# Patient Record
Sex: Female | Born: 1935 | Race: White | Hispanic: No | Smoking: Never smoker
Health system: Southern US, Community
[De-identification: ages and names within clinical notes are randomized; demographics above are authoritative.]

## PROBLEM LIST (undated history)

## (undated) DIAGNOSIS — K5790 Diverticulosis of intestine, part unspecified, without perforation or abscess without bleeding: Secondary | ICD-10-CM

## (undated) DIAGNOSIS — M810 Age-related osteoporosis without current pathological fracture: Secondary | ICD-10-CM

## (undated) DIAGNOSIS — E079 Disorder of thyroid, unspecified: Secondary | ICD-10-CM

## (undated) DIAGNOSIS — R269 Unspecified abnormalities of gait and mobility: Secondary | ICD-10-CM

## (undated) DIAGNOSIS — E119 Type 2 diabetes mellitus without complications: Secondary | ICD-10-CM

## (undated) DIAGNOSIS — F0390 Unspecified dementia without behavioral disturbance: Secondary | ICD-10-CM

## (undated) DIAGNOSIS — F329 Major depressive disorder, single episode, unspecified: Secondary | ICD-10-CM

## (undated) HISTORY — DX: Type 2 diabetes mellitus without complications: E11.9

## (undated) HISTORY — PX: CATARACT EXTRACTION W/ INTRAOCULAR LENS IMPLANT: SHX1309

## (undated) HISTORY — DX: Unspecified abnormalities of gait and mobility: R26.9

## (undated) HISTORY — DX: Age-related osteoporosis without current pathological fracture: M81.0

## (undated) HISTORY — DX: Major depressive disorder, single episode, unspecified: F32.9

## (undated) HISTORY — DX: Diverticulosis of intestine, part unspecified, without perforation or abscess without bleeding: K57.90

---

## 1999-09-25 ENCOUNTER — Encounter: Admission: RE | Admit: 1999-09-25 | Discharge: 1999-09-25 | Payer: Self-pay | Admitting: *Deleted

## 1999-09-25 ENCOUNTER — Encounter: Payer: Self-pay | Admitting: *Deleted

## 2000-05-30 ENCOUNTER — Ambulatory Visit (HOSPITAL_COMMUNITY): Admission: RE | Admit: 2000-05-30 | Discharge: 2000-05-30 | Payer: Self-pay | Admitting: Gastroenterology

## 2000-09-30 ENCOUNTER — Encounter: Payer: Self-pay | Admitting: Geriatric Medicine

## 2000-09-30 ENCOUNTER — Encounter: Admission: RE | Admit: 2000-09-30 | Discharge: 2000-09-30 | Payer: Self-pay | Admitting: Geriatric Medicine

## 2001-10-02 ENCOUNTER — Encounter: Admission: RE | Admit: 2001-10-02 | Discharge: 2001-10-02 | Payer: Self-pay | Admitting: Geriatric Medicine

## 2001-10-02 ENCOUNTER — Encounter: Payer: Self-pay | Admitting: Geriatric Medicine

## 2001-12-04 ENCOUNTER — Other Ambulatory Visit: Admission: RE | Admit: 2001-12-04 | Discharge: 2001-12-04 | Payer: Self-pay | Admitting: Geriatric Medicine

## 2002-10-04 ENCOUNTER — Encounter: Admission: RE | Admit: 2002-10-04 | Discharge: 2002-10-04 | Payer: Self-pay | Admitting: Geriatric Medicine

## 2002-10-04 ENCOUNTER — Encounter: Payer: Self-pay | Admitting: Geriatric Medicine

## 2003-11-09 ENCOUNTER — Encounter: Admission: RE | Admit: 2003-11-09 | Discharge: 2003-11-09 | Payer: Self-pay | Admitting: Geriatric Medicine

## 2004-02-25 ENCOUNTER — Emergency Department (HOSPITAL_COMMUNITY): Admission: EM | Admit: 2004-02-25 | Discharge: 2004-02-25 | Payer: Self-pay | Admitting: *Deleted

## 2004-02-25 ENCOUNTER — Inpatient Hospital Stay (HOSPITAL_COMMUNITY): Admission: AD | Admit: 2004-02-25 | Discharge: 2004-03-07 | Payer: Self-pay | Admitting: Internal Medicine

## 2004-02-27 ENCOUNTER — Encounter (INDEPENDENT_AMBULATORY_CARE_PROVIDER_SITE_OTHER): Payer: Self-pay | Admitting: *Deleted

## 2004-02-29 ENCOUNTER — Encounter (INDEPENDENT_AMBULATORY_CARE_PROVIDER_SITE_OTHER): Payer: Self-pay | Admitting: *Deleted

## 2004-03-05 ENCOUNTER — Encounter (INDEPENDENT_AMBULATORY_CARE_PROVIDER_SITE_OTHER): Payer: Self-pay | Admitting: *Deleted

## 2004-03-07 ENCOUNTER — Inpatient Hospital Stay (HOSPITAL_COMMUNITY)
Admission: RE | Admit: 2004-03-07 | Discharge: 2004-03-15 | Payer: Self-pay | Admitting: Physical Medicine & Rehabilitation

## 2004-03-23 ENCOUNTER — Encounter
Admission: RE | Admit: 2004-03-23 | Discharge: 2004-06-21 | Payer: Self-pay | Admitting: Physical Medicine & Rehabilitation

## 2004-04-20 ENCOUNTER — Ambulatory Visit (HOSPITAL_COMMUNITY): Admission: RE | Admit: 2004-04-20 | Discharge: 2004-04-20 | Payer: Self-pay | Admitting: Neurology

## 2004-10-25 ENCOUNTER — Ambulatory Visit (HOSPITAL_COMMUNITY): Admission: RE | Admit: 2004-10-25 | Discharge: 2004-10-25 | Payer: Self-pay | Admitting: Neurosurgery

## 2004-11-22 ENCOUNTER — Encounter: Admission: RE | Admit: 2004-11-22 | Discharge: 2004-11-22 | Payer: Self-pay | Admitting: Geriatric Medicine

## 2005-10-30 ENCOUNTER — Ambulatory Visit (HOSPITAL_COMMUNITY): Admission: RE | Admit: 2005-10-30 | Discharge: 2005-10-30 | Payer: Self-pay | Admitting: Neurosurgery

## 2006-01-29 ENCOUNTER — Encounter: Admission: RE | Admit: 2006-01-29 | Discharge: 2006-01-29 | Payer: Self-pay | Admitting: Geriatric Medicine

## 2006-05-20 ENCOUNTER — Encounter: Admission: RE | Admit: 2006-05-20 | Discharge: 2006-05-20 | Payer: Self-pay | Admitting: Geriatric Medicine

## 2006-06-04 ENCOUNTER — Encounter (INDEPENDENT_AMBULATORY_CARE_PROVIDER_SITE_OTHER): Payer: Self-pay | Admitting: Specialist

## 2006-06-04 ENCOUNTER — Other Ambulatory Visit: Admission: RE | Admit: 2006-06-04 | Discharge: 2006-06-04 | Payer: Self-pay | Admitting: Diagnostic Radiology

## 2006-06-04 ENCOUNTER — Encounter: Admission: RE | Admit: 2006-06-04 | Discharge: 2006-06-04 | Payer: Self-pay | Admitting: Geriatric Medicine

## 2006-06-17 ENCOUNTER — Encounter: Admission: RE | Admit: 2006-06-17 | Discharge: 2006-06-17 | Payer: Self-pay | Admitting: Geriatric Medicine

## 2006-08-26 ENCOUNTER — Encounter (INDEPENDENT_AMBULATORY_CARE_PROVIDER_SITE_OTHER): Payer: Self-pay | Admitting: *Deleted

## 2006-08-27 ENCOUNTER — Inpatient Hospital Stay (HOSPITAL_COMMUNITY): Admission: RE | Admit: 2006-08-27 | Discharge: 2006-08-28 | Payer: Self-pay | Admitting: General Surgery

## 2007-02-02 ENCOUNTER — Encounter: Admission: RE | Admit: 2007-02-02 | Discharge: 2007-02-02 | Payer: Self-pay | Admitting: Geriatric Medicine

## 2008-06-16 ENCOUNTER — Encounter: Admission: RE | Admit: 2008-06-16 | Discharge: 2008-06-16 | Payer: Self-pay | Admitting: Internal Medicine

## 2008-08-11 ENCOUNTER — Encounter: Admission: RE | Admit: 2008-08-11 | Discharge: 2008-08-11 | Payer: Self-pay | Admitting: Geriatric Medicine

## 2009-08-15 ENCOUNTER — Encounter: Admission: RE | Admit: 2009-08-15 | Discharge: 2009-08-15 | Payer: Self-pay | Admitting: Geriatric Medicine

## 2010-09-04 ENCOUNTER — Encounter: Admission: RE | Admit: 2010-09-04 | Discharge: 2010-09-04 | Payer: Self-pay | Admitting: Geriatric Medicine

## 2010-11-11 ENCOUNTER — Encounter: Payer: Self-pay | Admitting: Family Medicine

## 2011-03-08 NOTE — Discharge Summary (Signed)
NAMERAGHAD, Theresa Robinson            ACCOUNT NO.:  [id number]   MEDICAL RECORD NO.:  [id number]          PATIENT TYPE:  INP   LOCATION:  1430                         FACILITY:  [facility]   PHYSICIAN:  Adolph Pollack, M.D.DATE OF BIRTH:  07/05/1931   DATE OF ADMISSION:  08/26/2006  DATE OF DISCHARGE:  08/28/2006                               DISCHARGE SUMMARY   PRINCIPAL DISCHARGE DIAGNOSIS:  Adenomatous nodules of the thyroid  gland.   SECONDARY DIAGNOSIS:  Postoperative hypocalcemia.   PROCEDURE:  Near total thyroidectomy and excision of central neck mass.   REASON FOR ADMISSION:  Ms. Theresa Robinson is a 77 year old female who has  bilateral thyroid lesions.  They have follicular cells in them by needle  biopsy and thus a malignant process cannot be ruled out and she was  admitted for total thyroidectomy.   HOSPITAL COURSE:  She was admitted and underwent a near total  thyroidectomy and excision of a central neck mass which happened to be a  benign lymph node.  Her thyroid pathology fortunately was benign.  Postoperatively she had a little bit of hypocalcemia which was  transient.  I started her on calcium replacement and also on Rocaltrol  and she responded nicely and was able to be discharged on her second  postoperative day.   DISPOSITION:  Discharged to home on postop day #2.  She is to take  Synthroid as thyroid replacement as well as calcium and Rocaltrol.  I  also directed her to take two Tums tablets at bedtime and to continue  her home medications.  I plan to check her calcium in approximately 4  days and then her see back in the office in about 2 weeks.      Adolph Pollack, M.D.  Electronically Signed     TJR/MEDQ  D:  09/19/2006  T:  09/19/2006  Job:  [id number]   cc:   Hal T. Stoneking, M.D.  Fax: [phone]

## 2011-03-08 NOTE — Consult Note (Signed)
NAME:  Theresa Robinson, Theresa Robinson                         ACCOUNT NO.:  [id number]   MEDICAL RECORD NO.:  [id number]                   PATIENT TYPE:  INP   LOCATION:  3023                                 FACILITY:  [facility]   PHYSICIAN:  Hewitt Shorts, M.D.            DATE OF BIRTH:  1931/02/07   DATE OF CONSULTATION:  03/01/2004  DATE OF DISCHARGE:                                   CONSULTATION   HISTORY OF PRESENT ILLNESS:  The patient is a 77 year old white female who  has an approximately three week history of double vision and gait  difficulties. She finds that she tends to walk to the left, and even when  sitting she tends to lean to the left. She finds that the double vision is  least when looking to the left, more so when looking centrally and worse  when looking to the right.   The patient initially underwent evaluation with her primary physician, Dr.  Pete Glatter. Subsequently she was admitted five days ago. It is suspected that  she may have suffered a stroke, and stroke neurology consultation from Dr.  Delia Heady was obtained.   MRI of the brain without and gadolinium was obtained and shows multiple  enhancing mass lesions in the left brachium pontis. The right occipital lobe  and numerous small lesions sprinkled through the cerebral hemispheres  bilaterally.   Extensive workup has been pursued and has been unrevealing. This included  unremarkable CT scans of the chest, abdomen, and pelvis; a normal  echocardiogram and transesophageal echocardiogram; nonreactive HIV test;  normal thyroid function tests; and a spinal tap which showed a lymphocytosis  but which was otherwise unremarkable. Spinal tap showed 4 red blood cells,  18 white blood cells, 95% lymphocytes with a normal glucose of 60, and a  mildly elevated protein of 58. ____________ bands are pending, but fungal  smear, gram stain, and AFB smear were negative. Cytology in the  cerebrospinal fluid showed lymphocytosis but  was otherwise unremarkable.   Neurosurgical consultation was requested by Dr. Pearlean Brownie for brain biopsy with  stereotaxis.   The patient denies headache, speech or language difficulties, swallowing  difficulties, seizures, or weakness.   PAST MEDICAL HISTORY:  Notable for history of depression which is treated  with Paxil. She denies any history of hypertension, myocardial infarction,  cancer, stroke, diabetes, peptic ulcer disease, or lung disease.   PAST SURGICAL HISTORY:  Previous surgical history of hysterectomy and  Cesarean section.   ALLERGIES:  She denies allergies to medications.   MEDICATIONS:  Paxil 10 mg q.a.m. and 20 mg q.p.m.   FAMILY HISTORY:  Mother had diabetes and died of bladder cancer. Father died  of complications of alcoholism. Sister had a benign brain tumor which was  resected but is disabled. Another sister had breast cancer and is alive and  well. Another sister has rheumatoid arthritis. Two sisters are alive and  well. One brother  has gout and heart disease. Two brothers are alive and  well.   SOCIAL HISTORY:  The patient is separated for the past 10 years. She has a  retarded son who lives at [place]. She does not smoke. She drank alcoholic  beverages rarely. She worked doing Warehouse manager work. She was raised in [place]  but lives in [place].   REVIEW OF SYSTEMS:  Notable for systems described in history of present  illness and past medical history but otherwise unremarkable.   PHYSICAL EXAMINATION:  GENERAL:  The patient is a well-developed, well-  nourished, white female in no acute distress.  VITAL SIGNS:  Her temperature is 98.2, pulse 62, blood pressure 111/68,  respiratory rate 18.  LUNGS:  Clear to auscultation. She has symmetric respiratory excursion.  HEART:  Regular rate and rhythm. Normal S1 and S2. There is no murmur.  ABDOMEN:  Soft, nondistended, bowel sounds are present.  EXTREMITIES:  Shows no clubbing, cyanosis, or edema.   NEUROLOGICAL:  Mental status:  The patient is awake, alert. She is oriented  to her name, [facility], [place], and May 2005. Speech is  fluent. She has good comprehension. Cranial nerves show pupils are equal,  round, and reactive to light. Extraocular movements show left esotropia and  a right eye lateral gaze paresis consistent with a right sixth nerve  paresis. Facial sensation is intact to facial movement, is symmetrical.  Hearing is intact. Palate movement is symmetrical. Shoulder shrug is  symmetrical. Tongue is midline. Motor examination shows 5/5 strength in the  upper and lower extremities. She has no drift of the upper extremities.  Sensation is intact to pinprick. Reflexes are symmetrical. Toes are  downgoing. Gait and stance were not tested.   IMPRESSION:  The patient with diplopia and gait difficulties with multiple  brain lesions, the age of which is uncertain. Extensive workup has been  unrevealing.   RECOMMENDATIONS:  I agree with Dr. Pearlean Brownie and the [facility] hospitalist that  brain biopsy is appropriate. I favor approaching the right occipital lesion  which is mostly superficial and largest via right occipital stereotactic  craniotomy with Stealth stereotaxis. I have explained the nature of the  procedure with the patient, discussed specifically the surgery and  postoperative course. We discussed risks of surgery including risks of  infection, bleeding, possible need for transfusion, risk of neurologic  dysfunction including loss of lesion in the left visual field, paralysis,  coma, and death. We discussed anesthetic risk from myocardial infarction,  stroke, pneumonia, and death. She does understand that left untreated most  likely she will eventually loose vision in the left visual field.   After discussing all of this, she does wish to proceed with surgery. We will  plan on scheduling that for Monday, May 16. We will planning on obtaining a CT scan with Stealth  protocol.                                               Hewitt Shorts, M.D.    RWN/MEDQ  D:  03/01/2004  T:  03/02/2004  Job:  [id number]   cc:   Hal T. Stoneking, M.D.  [address] [place], [place] [address]  Fax: [phone]   Pramod P. Pearlean Brownie, MD  Fax: [phone]

## 2011-03-08 NOTE — Consult Note (Signed)
NAME:  Theresa Robinson, Theresa Robinson                         ACCOUNT NO.:  [id number]   MEDICAL RECORD NO.:  [id number]                   PATIENT TYPE:  INP   LOCATION:  3023                                 FACILITY:  [facility]   PHYSICIAN:  Meade Maw, M.D.                 DATE OF BIRTH:  1931-03-01   DATE OF CONSULTATION:  02/28/2004  DATE OF DISCHARGE:                                   CONSULTATION   REQUESTING PHYSICIAN:  Pramod P. Pearlean Brownie, M.D.   INDICATIONS FOR CONSULTATION:  Evaluation for TEE.   HISTORY:  Theresa Robinson is a very pleasant 77 year old female who has had  difficulty with gait disturbance for more than two weeks. She has also  noticed double vision. This was associated with dizziness. There has been no  nausea, vomiting, headaches, or fever. She has recently underwent a  transthoracic echocardiogram which was suboptimal. Overall, it was felt that  her ejection fraction was 55 to 65%. There was mild aortic valve thickness  and mild to moderate aortic insufficiency noted.   PAST MEDICAL HISTORY:  Significant for depression.   PAST SURGICAL HISTORY:  Significant for hysterectomy and Cesarean section.   ALLERGIES:  None.   HOME MEDICATIONS:  1. Paxil 10 mg in the morning and 20 mg at night.  2. Vitamin D and calcium.   SOCIAL HISTORY:  The patient has been separated from her husband for 10  years. She lives alone in [place]. She has one child who is mentally  retarded. She has several brothers and sisters who live in [place].   REVIEW OF SYSTEMS:  She has had no fevers, no weight loss, no cough, no  chest pain, no difficulty with swallowing.   PHYSICAL EXAMINATION:  GENERAL:  Reveals an elderly female in no acute  distress. Orlene Erm is appropriate. She is afebrile. Blood pressure is 90  to 104 systolically with diastolic of 50 to 60. O2 saturations have been 96  to 97% on room air. Telemetry revealed sinus rhythm with rate of 60s to 80s  and no ectopy noted.  HEENT:  Unremarkable.  PULMONARY:  Reveals breath sounds which are equal and clear to auscultation.  CARDIOVASCULAR:  Reveals a regular rate and rhythm. There is a quite  diastolic murmur noted in the upright position. PMI is not displaced.  EXTREMITIES:  Revealed no peripheral edema.   STUDIES:  The patient has underwent a MRA of her neck and head. She had a  negative intracranial MR and normal angiography of the neck vessels. CT of  the head revealed an acute right occipital lobe infarct. Laboratory data  reveals a white count of 5.2, hemoglobin of 13.9, platelet count of 172. She  has normal electrolytes. Creatinine is 1.0. ECG reveals a sinus rhythm.  Normal R wave progression, normal ECG.   IMPRESSION:  A 77 year old female with new right occipital infarct. Workup  thus far has been  negative. She has a suboptimal transthoracic  echocardiogram. We will proceed with transesophageal echocardiogram for  further evaluation. The patient has no contraindication to the above  procedure.                                               Meade Maw, M.D.    HP/MEDQ  D:  02/28/2004  T:  02/28/2004  Job:  [id number]   cc:   Pramod P. Pearlean Brownie, MD  Fax: [phone]   Hal T. Stoneking, M.D.  [address] [place], [place] [address]  Fax: [phone]

## 2011-03-08 NOTE — Discharge Summary (Signed)
NAME:  Theresa, Robinson                         ACCOUNT NO.:  [id number]   MEDICAL RECORD NO.:  [id number]                   PATIENT TYPE:  IPS   LOCATION:  4027                                 FACILITY:  [facility]   PHYSICIAN:  Ranelle Oyster, M.D.             DATE OF BIRTH:  December 22, 1930   DATE OF ADMISSION:  03/07/2004  DATE OF DISCHARGE:  03/15/2004                                 DISCHARGE SUMMARY   DISCHARGE DIAGNOSIS:  1. Multiple brain lesions status post craniotomy, etiology unknown, status     post craniotomy/resection, etiology unknown.  2. History of depression.   HISTORY OF PRESENT ILLNESS:  The patient is a 77 year old white female with  past history of depression admitted on Feb 25, 2004 for evaluation of double  vision x2 weeks, imbalance, and dizziness. MR revealed scattered lesion  within the brain stem, cerebellum, and cerebral hemispheres. She also has  normal CSF, spinal fluid showed high albumin but no oligoclonal bands.  Consulted by neurology who thought the patient had MS versus metastatic  disease. The patient underwent a right occipital craniotomy with  intraoperative stealth stereotactic/microdissection and resection of  occipital lesion by Dr. Wyn Quaker on Mar 05, 2004. Brain biopsy is  pending at this time. The patient had also had a TEE performed and all  results have been normal.   PT is reporting at this time that the patient is ambulating 180 feet with  rolling walker, minimal assist, transfer, sit to stand with minimal assist,  bed mobility modified independent.  Placed on Decadron. The patient also  found to have thyroid nodules. Thyroid scan ordered on Mar 07, 2004.   Admitted for fever, constipation, hypoglycemia secondary to steroids. The  patient is presently on a Decadron taper. The patient was transferred to  [facility] Department on Mar 07, 2004.   PAST MEDICAL HISTORY:  As above.   PAST SURGICAL HISTORY:  Hysterectomy.   PRIMARY CARE Ilma Achee:  Dr. Marlana Latus.   ALLERGIES:  No known drug allergies.   SOCIAL HISTORY:  The patient lives in a one level home in [place], [place]. Independent prior to admission, five step entry. Denies any  tobacco or alcohol use. Has one son who has MR. Separated and retired.   ADMISSION MEDICATIONS:  1. Paxil 10 mg in the day and 20 mg a night.  2. Aspirin 81 mg once daily.  3. Calcium daily.   HOSPITAL COURSE:  Theresa Robinson was admitted to [facility]  Department on Mar 07, 2004 for comprehensive patient rehabilitation to  receive more than 3 hours of therapy daily. From a rehab standpoint, the  patient progressed very well with physical therapy and occupational therapy.  She was discharged at a modified independent level. The patient stayed in  the ADL apartment prior to discharge without any significant problems at  all. The patient remained on Decadron throughout most  of her day in rehab  and was discharged on Decadron 2 mg daily for 5 days. The patient did  experience a change in vision. She still continued to have double vision and  visual fields seemed to be shortened. Due to new symptom of shortening  vision, we performed a repeat head CT on Mar 08, 2004. It revealed multiple  enhancing lesions again noted in prior CTs and MRAs, and a small right  occipital region with an increase in edema. Dr. Ezzard Standing was aware of this  visual change and did not think it was very significant. Due to multiple  lesions, he suspected the patient to have different problems with vision. At  this time, brain biopsy is still pending at this time. It will probably be 2  weeks before results will actually return. The patient had no DVT  prophylaxis as she was ambulating. Besides occasional constipation, she had  no other really significant medical issues while in rehab. CBGs were taken  periodically while she was on Decadron and this discontinued because CBGs   were within functional limits. The patient also remained on Paxil 20 mg  q.h.s. __________ and 10 mg at night. The surgical incision on her head  healed very well. Staples were discontinued on scalp on Mar 12, 2004 and  Steri-Strips were applied. There were no other major issues that occurred  while patient was in rehab.   LABORATORY DATA:  She had a urine culture performed on Mar 07, 2004 with  3000 colonies. She had a hemoglobin of 11.3, hematocrit 32.8, white blood  cells 8.9, platelet count 209. Sodium 140, potassium 3.8, chloride 110, CO2  of 27, glucose 120, BUN 15, creatinine 0.7. AST 52, ALT 42, alkaline  phosphatase 88.   At the time of discharge, PT report indicates that the patient is able to  ambulate approximately 220 feet modified independently with no significant  device, transfer modified independently, bed mobility modified independent,  performance of ADLs modified independent. The patient's cognition with  memory within functional limits. The patient, from a PT standpoint, has made  good progress with mobility. The patient still with visual deficits and high  level balance deficits but self-corrects loss of balance and reached  modified independently level. To be discharged with modified independently  level due to vision deficits. From a therapeutic recreational level, the  patient has made great progress toward goals.  Visual deficits diminishes  further independence with TR tasks.   The patient is ready for discharge home with intermittent supervision. The  patient will continue to have rotating visual field loss with eye glass  blocked with tape. . At time of discharge, all vitals stable. The patient is  transferred home with her family. Surgical incision shows no signs of  infection.   DISCHARGE MEDICATIONS:  1. Paxil 20 mg at night and 10 mg at night.  2. Decadron 2 mg x5 days then to be discontinued. 3. Multivitamin daily.  4. Vicodin one to two tablets as  needed for any pain or headache she has.   DISCHARGE INSTRUCTIONS:  Pain is managed with Vicodin or Tylenol. No  driving, no smoking, no alcohol. She will have outpatient PT and OT at [facility], first appointment on June 3 at 11 a.m., the next appointment on June 6  at 3:30.   FOLLOW UP:  Dr. Wyn Quaker in 2 weeks, call for an appointment. Follow-up  with Dr. Pete Glatter, primary care Kyrielle Urbanski as needed, and Dr. Pearlean Brownie within  6  weeks.      Drucilla Schmidt, P.A.                         Ranelle Oyster, M.D.    LB/MEDQ  D:  03/15/2004  T:  03/17/2004  Job:  [id number]   cc:   Hewitt Shorts, M.D.  [address]  [place]  [place] [address]  Fax: [phone]   Hal T. Stoneking, M.D.  [address] [place], [place] [address]  Fax: [phone]

## 2011-03-08 NOTE — Op Note (Signed)
NAME:  Theresa Robinson, Theresa Robinson                         ACCOUNT NO.:  [id number]   MEDICAL RECORD NO.:  [id number]                   PATIENT TYPE:  INP   LOCATION:  3111                                 FACILITY:  [facility]   PHYSICIAN:  Hewitt Shorts, M.D.            DATE OF BIRTH:  December 07, 1930   DATE OF PROCEDURE:  03/05/2004  DATE OF DISCHARGE:                                 OPERATIVE REPORT   PREOPERATIVE DIAGNOSIS:  Multiple enhancing brain lesions with associated  neurologic deficits, including diplopia and imbalance.   POSTOPERATIVE DIAGNOSIS:  Multiple enhancing brain lesions with associated  neurologic deficits, including diplopia and imbalance.   PROCEDURE:  Right occipital craniotomy with intraoperative Stealth  stereotaxis and microdissection and resection of right occipital lesion.   SURGEON:  Hewitt Shorts, M.D.   ASSISTANT:  Hilda Lias, M.D.   ANESTHESIA:  General endotracheal.   INDICATIONS:  The patient is a 77 year old woman who presented with diplopia  and imbalance, who was found on CT and MRI to have multiple enhancing brain  lesions, the largest of which was in the right occipital region.  After an  extensive workup by the medicine and neurology services a diagnosis could  not be established, and therefore a brain biopsy was requested.  A decision  was made to proceed with open craniotomy and intraoperative Stealth  stereotaxis to localize the lesion for accurate biopsy.   PROCEDURE:  Prior to surgery the patient underwent a CT scan of the brain  with contrast with Stealth protocol.  The data was loaded into the Stealth  unit and preoperative planning was performed.   On the day of surgery the patient was brought to the operating room and  placed under general endotracheal anesthesia.  The patient was placed in a  three-pin Mayfield head holder, was brought up to a left-side down lateral  decubitus position, and then was gently rolled a bit further so as  to expose  the right occipital region.  All bony prominences were padded as needed, and  the patient was secured to the operating room table.  The scalp was shaved.  The presurgical registration with the Stealth unit was performed.  The right  occipital scalp was then prepped with Betadine soap and solution and draped  in a sterile fashion.  Using the intraoperative Stealth, we were able to  localize the scalp region overlying the tumor, which was infiltrated with  local anesthetic with epinephrine, and then a vertical right occipital  incision was made.  Rainey clips were applied to the scalp edges to maintain  hemostasis.  Dissection was taken down to the galea and periosteum, which  was split and divided, and the scalp retracted to either side.  We again  localized the lesion relative to the skull surface and the craniotomy bone  flap was planned.  Two bur holes were made using the [facility] Max drill.  Edges  of  the bone were waxed as necessary.  The dura was dissected from the  overlying skull, and then the craniotome attachment was used to turn the  bone flap, which was elevated and set aside to later be returned.  Nurolon 4-  0 suture was used to place multiple tack-up sutures and then the dura was  opened in an inverted U-shaped fashion, hinged inferiorly.  The brain  appeared mildly swollen.  Again using the Stealth we were able to localize  the tumor relative to our dural exposure.  The pial surface was coagulated  and divided, and then we were able to dissect down to the lesion.  The  microscope was draped and brought into the field to provide additional  magnification, illumination, and visualization, and the remainder of the  resection of the lesion was performed using microdissection and  microsurgical technique.  Further cortical vessels and pial vessels were  coagulated and divided as necessary.  We were able to dissect down to tissue  that clearly appeared abnormal.  Specimens  were obtained and sent to  pathology for both frozen and permanent section.  The case was discussed  thoroughly with Dr. Kieth Brightly, the pathologist, who reported that the  frozen section appeared to show a hypercellular tissue.  It was difficult  for her to characterize it beyond that.  Additional specimens were obtained  for further permanent section.  The resection site was irrigated, checked  for hemostasis, which was established with the use of bipolar cautery, and  then the resection site was lined with Surgicel.  The dura was approximated  with interrupted 4-0 Nurolon sutures.  The bone flap was secured with  RapidFire plates using one square and two medium straight plates.  The scalp  flap was closed in multiple layers, the galea closed with interrupted,  inverted 2-0 undyed Vicryl sutures, the skin edges closed with surgical  staples.  The wound was dressed with Adaptic and sterile gauze and dressed  with Hypafix.  The patient was then taken out of the three-pin Mayfield head  holder, brought back to a supine position, to be subsequently reversed from  the anesthetic, extubated, and transferred to the recovery room for further  care.  The estimated blood loss was 175 mL.  Sponge and needle count were  correct.                                               Hewitt Shorts, M.D.    RWN/MEDQ  D:  03/05/2004  T:  03/06/2004  Job:  [id number]

## 2011-03-08 NOTE — Op Note (Signed)
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   POSTOPERATIVE DIAGNOSES:  1. 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  2. Central neck mass.   PROCEDURES:  1. Near-total thyroidectomy.  2. Excision of central neck mass.   SURGEON:  Adolph Pollack, M.D.   ASSISTANT:  Velora Heckler, MD   ANESTHESIA:  General.   INDICATION:  Theresa Robinson was formerly Ms. Theresa Robinson when I saw her in the  office, and she is recently married.  She is 72, noted to have a right  thyroid neck mass.  She had an ultrasound demonstrating bilateral masses and  needle biopsies were performed, demonstrating follicular lesions on both  sides.  The right nodule was also cold.  She now presents for the above  procedure.  Specifically, we talked about a near-total to total  thyroidectomy.   TECHNIQUE:  She was seen in the holding area, then brought to the operating  room, placed supine on the operating table, and a general anesthetic was  administered.  The neck was placed in slight extension.  The neck and upper  chest were sterilely prepped and draped.  A curvilinear incision was made  one fingerbreadth above the clavicles in the neck bilaterally.  The  subcutaneous tissue was divided and platysma divided.  Subplatysmal flaps  were then raised to the thyroid cartilage superiorly and the substernal edge  inferiorly.  The strap muscles was divided.  I identified a central neck  mass and also the right lobe of the thyroid gland and dissected the strap  muscles free from the right lobe of the thyroid gland.  I then  medialized  the right lobe of the thyroid gland.  I divided the superior branches  between ligatures and clips and mobilized the superior lobe of the right  thyroid gland.  I then identified an inferior parathyroid gland, stripped it  free from the thyroid gland, keeping its lateral blood supply intact.  I  then divided the inferior vessels between clips.  I stayed on the capsule of  the thyroid gland and identified what I felt was a recurrent laryngeal nerve  and stayed above this and divided small medial vessels between clips.  I  then mobilized the thyroid gland off the trachea.  I did not see a definite  superior parathyroid gland.  I mobilized the isthmus with the cautery.   Following this I approached the left side of the gland.  I dissected the  strap muscles free from the gland bluntly and medialized the gland.  I  mobilized the superior aspect of the gland by dividing the vessels between  ligatures and clips, staying on the capsule.  I identified a superior  parathyroid gland and  stripped this free from the thyroid gland, keeping its  lateral blood supply intact.  I then mobilized the medial aspect of the  gland by dividing the middle thyroidal veins.  I identified what I felt was  the recurrent laryngeal nerve, and it was very adherent to a portion of the  thyroid.  I then used electrocautery to mobilize the thyroid off the trachea  in this area and leave a small amount of thyroid tissue behind.  I divided  the inferior vessels between clips and then mobilized and then excised the  complete thyroid gland and sent it to pathology, marking the right lobe with  a suture.   Following this I approached the central neck mass, which appeared to be  separate from the thyroid gland.  I carefully dissected it free from the  surrounding structures and clipped small vessels and sent it separately to  pathology after it was removed.   I examined both areas.  I identified what I felt were  both recurrent  laryngeal nerves, which were intact.  The parathyroid glands which I had  identified were viable.  I then irrigated the area.  Hemostasis was  adequate.  Surgicel was placed bilaterally.  I then reapproximated the strap  muscles with interrupted 3-0 Vicryl sutures.  The platysma was  reapproximated with interrupted 3-0 Vicryl sutures.  The skin was closed  with a 4-0 Monocryl subcuticular stitch, followed by Steri-Strips and a  sterile dressing.   She tolerated the procedure well without apparent complications and was  taken to the recovery room in satisfactory condition.      Adolph Pollack, M.D.  Electronically Signed     TJR/MEDQ  D:  08/26/2006  T:  08/26/2006  Job:  [id number]   cc:   Hal T. Stoneking, M.D.  Fax: [phone]

## 2011-03-08 NOTE — Consult Note (Signed)
Theresa Robinson, Theresa Robinson                         ACCOUNT NO.:  [id number]   MEDICAL RECORD NO.:  [id number]                   PATIENT TYPE:  INP   LOCATION:  3023                                 FACILITY:  [facility]   PHYSICIAN:  Pramod P. Pearlean Brownie, MD                 DATE OF BIRTH:  1931-04-20   DATE OF CONSULTATION:  02/25/2004  DATE OF DISCHARGE:                                   CONSULTATION   REFERRING PHYSICIAN:  Melissa L. Ladona Ridgel, MD   REASON FOR CONSULTATION:  Stroke.   HISTORY OF PRESENT ILLNESS:  Theresa Robinson is a pleasant 77 year old lady who  has had gait difficulties for the last two weeks as well some double vision  for the last one week. The patient states that she initially noticed that  she could not walk straight and she kept on leaning to the right. She  describes dizziness with a feeling of imbalance. She denied significant  nausea, headache, or vomiting. She was seen by Dr. Pete Glatter in his office a  few days later and he noted normal blood pressure and cardiac exam, and  thought she may perhaps have labyrinthitis. She went home and was taking  meclizine. However, a few days later she developed double vision. She  describes this as seen intermittently mainly when she looks to the right and  distant vision. These symptoms persisted and yesterday she went to the  Urgent Care for these complaints.  She was told that she had had a stroke.  An MRI scan was arranged at [facility] with Dr. __________  for a  week. The patient fell at home this morning bruising her left side ribs and  having some bleeding through her nose. She called a friend who brought her  to the emergency room at [facility]. The patient was transferred from there  upon request for better stroke management at [facility]. She has no  prior history of stroke, transient ischemic attacks, or significant  neurological complaints.   PAST MEDICAL HISTORY:  Significant only for depression for which  she has  taken Paxil for several years.   PAST SURGICAL HISTORY:  Hysterectomy and Cesarean section.   MEDICATION ALLERGIES:  None.   HOME MEDICATIONS:  1. Paxil 10 mg in the morning and 20 mg at night.  2. Calcium and vitamin D two tablets in the morning.   Aspirin has been lifted as a daily medication, however the patient denied  this to me.   SOCIAL HISTORY:  The patient is separated from her husband 10 years ago. She  lives alone in [place]. She does not smoke or drink.   REVIEW OF SYSTEMS:  Not significant for recent fever, loss of weight, cough,  chest pain, or diarrheal illness.   FAMILY HISTORY:  Not significant for anybody with neurological problems  except for one brother who had a benign brain tumor.  PHYSICAL EXAMINATION:  GENERAL: A pleasant elderly lady who is not in  distress.  VITAL SIGNS: Carotid pulse rate is 66 per minute and regular, respiratory  rate 20 per minute, blood pressure 139/75.  Saturations 985 on room air.  Distal pulses are felt.  HEENT: Head is normocephalic. ENT exam unremarkable.  NECK: Supple without bruits.  CARDIAC: No murmur or gallop.  LUNGS: Clear to auscultation.  NEUROLOGIC: The patient is awake, alert, and oriented times three with  normal speech and language function. There is no aphasia, apraxia, or  dysarthria. Pupils are both 3 mm, equal, reactive to light and  accommodation. The patient has disconjugate gaze. The left eye is esotropic.  She has a right 6th nerve paresis and is unable to look completely to the  right.  Leftward movements are fine.  Up and down gaze is also okay. There  is no nystagmus seen. Visual acuity and field is adequate. Face is  symmetric. Palate normal. Tongue is midline. Motor and sensory exam reveals  symmetric upper and lower extremities, __________  tone, reflexes,  coordination and sensation. Fine finger movements are symmetric. Finger-to-  nose and __________  ataxia are accurate. She has  truncal ataxia and tends  to lean to the left when she sits up. She is unsteady while walking and  again leans to the left.   DATA:  A noncontrast CAT scan of the head done today reveals an area of low  density in the right occipitoparietal region. No definite low density  infarction can be identified in the brainstem. No other abnormalities are  noted.   Admission laboratories are pending.   Carotid ultrasound done today reveals no hemodynamically significant  stenosis. Vertebral artery flow is antegrade bilaterally.   IMPRESSION:  A 77 year old lady with subacute gait ataxia as well as  horizontal diplopia likely secondary to posterior circulation subacute  infarction. Localization likely in the brainstem though she may have another  infarction in the left occipital region as well.   PLAN:  The patient is being admitted for stroke risk stratification workup.  I agree with planning an MRI scan of the brain with MRA of the brain and  neck, cardiac echo, fasting hemoglobin A1C, lipid profile, and homocysteine.  Check transcranial Doppler studies.  Physical therapy consult for gait  training. Will start Aggrenox one capsule a day for a week and then twice a  day as tolerated without headaches.  I had a long discussion with her  patient and her friend regarding the initial symptoms, assessment, plan,  evaluation, treatment, and answered questions. Also telemetry monitoring to  rule out any cardiac arrhythmias. Thank you for the referral.  I look  forward to seeing her in followup.                                               Pramod P. Pearlean Brownie, MD    PPS/MEDQ  D:  02/25/2004  T:  02/27/2004  Job:  [id number]

## 2011-03-08 NOTE — H&P (Signed)
NAME:  Theresa Robinson, Theresa Robinson                         ACCOUNT NO.:  [id number]   MEDICAL RECORD NO.:  [id number]                   PATIENT TYPE:  INP   LOCATION:  ED                                   FACILITY:  [facility]   PHYSICIAN:  Theressa Millard, M.D.                 DATE OF BIRTH:  [birth year]   DATE OF ADMISSION:  02/25/2004  DATE OF DISCHARGE:                                HISTORY & PHYSICAL   Theresa Robinson is a 77 year old white female admitted with double vision and  imbalance.  Approximately April 23, she noted that she was not walking  straight.  She was not dizzy.  She saw Dr. Pete Glatter on April 27, and he  noted a normal blood pressure and cardiac exam.  Her feet felt wobbly.  On  April 29, she developed double vision.  She fell twice on May 1.  The double  vision and imbalance have persisted through the past week.  She tried to  call our office but had trouble getting through to [place], and yesterday she  went to Urgent Care.  She was told she had a stroke.  She was set up for an  MRI next week.  This morning, however, she awoke with a nose bleed and as  she tried to get to the bathroom, she fell bruising her left ribs.  She did  not pass out.  She came to the emergency department.  She denies vertigo and  headache.   PAST MEDICAL HISTORY:  1. Surgical hysterectomy and C-section.  2. Medical depression on Paxil for several years.  3. History of heart murmur.   ALLERGIES:  None known.   MEDICATIONS:  1. Paxil 10 mg q.a.m. and 20 mg q.p.m.  2. Calcium 600 mg plus vitamin D 2 q.a.m.  3. Aspirin daily.   SOCIAL HISTORY:  Separated x 10 years.  She now lives alone.  Smoking:  None.  Alcohol:  Rare.   FAMILY HISTORY:  Mother had diabetes and died of bladder cancer.  Father  died of complications of alcoholism.  Sister had benign brain tumor resected  but is now disabled.  Another sister had breast cancer and is alive and  well.  Another sister had rheumatoid arthritis.  Two sisters are  alive and  well.  One brother had gout and heart disease.  Two brothers alive and well.   REVIEW OF SYSTEMS:  Right upper quadrant x 1-2 days with no nausea and  vomiting.  All other systems are negative.   PHYSICAL EXAMINATION:  VITAL SIGNS:  Blood pressure 139/75, pulse 66,  respiratory 20, O2 saturations 98%.  See  neurologic exam for examination of  eyes.  EARS/NOSE/THROAT:  Unremarkable.  NECK:  Supple.  CHEST:  Clear to auscultation and percussion.  CARDIAC:  Normal S1 and S2 without S3, S4, murmur, rub, or click.  ABDOMEN:  Soft and nontender.  NEUROLOGIC:  Oriented x 3.  Cranial nerves reveal the visual fields to be  full to confrontation and both eyes tested individually.  There is atonic  deviation of the left eye medially at rest.  Double vision in all fields  with gaze worse in the right horizontal field of gaze.  Other cranial nerves  are normal.  Motor 5/5.  Finger-to-nose-finger is normal.  She has mild  truncal imbalance while sitting with eyes open or closed.  She has a quite  Romberg, falling to the left side immediately with eye open or closed.   Chest x-ray shows no rib fracture.  CT scan of the head is pending.  Laboratory data include a CBC and a CMET which are normal.   IMPRESSION:  1. Double vision imbalance.  Typically would not expect such severe     imbalance with only a cranial nerve palsy.  Other CNS processes must be     ruled out.  CT scan is pending.  MRI is ordered.  2. Depression.  This is stable.                                               Theressa Millard, M.D.    JO/MEDQ  D:  02/25/2004  T:  02/25/2004  Job:  [id number]

## 2011-03-08 NOTE — Discharge Summary (Signed)
NAME:  Theresa Robinson, Theresa Robinson                         ACCOUNT NO.:  [id number]   MEDICAL RECORD NO.:  [id number]                   PATIENT TYPE:  INP   LOCATION:  3111                                 FACILITY:  [facility]   PHYSICIAN:  Isla Pence, M.D.             DATE OF BIRTH:  [birth year]   DATE OF ADMISSION:  02/25/2004  DATE OF DISCHARGE:  03/07/2004                                 DISCHARGE SUMMARY   DISCHARGE DIAGNOSES:  1. Scattered lesions within the brain stem, cerebellum and cerebral     hemispheres with predominance of involvement in the occipital lobes; the     patient is status post biopsy of occipital lobe lesions by Dr. Hewitt Shorts on the 16th of May, with biopsy results/pathology results     pending.  The patient's symptoms of diplopia and gait disorder are     thought secondary to these brain lesions.  2. Thyroid nodules as seen on the CT scan of her chest done on the 8th of     May.  Thyroid scan is on hold until post 6 weeks from her contrast study     that was done for a CT.  3. History of depression.  4. Mild hyperglycemia secondary to current Decadron medication.  5. She is status post cesarean section followed by, at a later time,     hysterectomy.  6. History of heart murmur.   DISCHARGE MEDICATIONS:  1. The patient was started on Decadron IV, status post her biopsy, and her     Decadron dose is currently at 2 mg IV q.12 h.  Dr. Newell Coral has not     written a discharge date on the Decadron at this point in time yet.  2. Colace 100 mg p.o. b.i.d.  3. Pepcid 20 mg p.o. q.12 h. as GI prophylaxis.  4. Insulin sliding scale with Humalog with no coverage for sugars less than     130.  5. Paxil 20 mg nightly and 10 mg every 2 p.m.  6. She is also on normal saline running at 50 mL/hr, status post her     craniotomy, and this can be discontinued, once her oral intake is     improved.  7. Vicodin 1-2 tablets p.o. q.4-6 h. p.r.n. pain.  8. Zofran 4-8 mg IV  q.6 h. p.r.n. nausea and vomiting, of which she has not     had any.   DISCHARGE ACTIVITY:  Discharge activity per rehab.   DISCHARGE DIET:  The patient is on no restrictions for diet.  She was begun  on clear diet with advance as tolerated.   HOSPITAL COURSE:  This 77 year old relatively healthy lady was admitted to  the [facility] on the 7th of May with complaints of double  vision and imbalance.  Her problems started approximately around February 11, 2004, when she noted that she was not  walking straight.  She denied any  complaints of dizziness.  She had seen her primary care physician, Dr. Ann Maki  T. Stoneking, on February 15, 2004 and he noted a normal blood pressure and  cardiac exam.  She noted that her feet felt wobbly.  On the 29th of April,  she developed double vision.  Since then, she had fallen twice on Feb 19, 2004.  The double vision and the imbalance persisted through the week prior  to her admission.  She subsequently came into the emergency room here after  being seen at urgent care and was told that she might have had a stroke and  was set up to have an MRI the following week, but she came into the  emergency room at [facility] the morning of admission after she woke up with some  nose bleeds.   The patient was subsequently admitted and underwent numerous studies.  She  had an initial CT scan on the date of admission which suggested suspicious  for acute right occipital lobe infarct.  She subsequently underwent MRI and  MRA of the brain and neck which came back showing the numerous scattered  lesions within the brain stem, cerebellum and cerebral hemispheres, with  predominant in the occipital lobe.  The MRA of her brain was fairly  unremarkable; there was a question of some irregularity of the distal  posterior cerebral artery branches.  The MRA of the neck vessels was also  normal.  As part of her workup for this, she subsequently underwent CT of  the abdomen,  pelvis and chest, all of which were fairly unremarkable, except  for the CT of the chest showing the thyroid nodules that suggested the  possibility of multinodular goiter.  In terms of her blood work, her initial  white count was entirely normal with white count at 5700, hemoglobin and  hematocrit at 14.7 and 43.3, platelet count of 165,000; her differential was  entirely normal.  Her most recent CBC done on the 17th of May still is  fairly unremarkable except for a slight left shift, status post Decadron  administration, but her white count is at 9500, hemoglobin and hematocrit  are stable at 12.6 and 36.9, platelet count of 194,000 and the differential  had 92% neutrophils, lymphocytes of 4%.  Her comprehensive metabolic panel  was fairly unremarkable also on admission.  She, as mentioned earlier,  underwent extensive laboratory workup including an ANA, complement levels,  lipid profile, hemoglobin A1c, homocysteine level; she also underwent LP by  neurology, who was consulted, ACE inhibitor studies looking for Sjogren's;  all of these have been negative.  Neurology was consulted after the initial  brain imaging came back with the lesions and they had done an LP on the  patient, with the LP being fairly unremarkable; she had no oligoclonal  bands.  Her sed rate was fairly unremarkable also at 13.  Her Lyme antibody  was also negative at 0.57 and as once again mentioned earlier, all of her  studies had come back negative; her HIV test was also negative.  Her spinal  fluid protein was at 58, which was just minimally elevated, glucose of 60.  She also had blood cultures done from the date of admission which have been  negative x2 sets.  Her CSF culture is also negative and so too, her AFB  smear is negative.  The CSF culture for fungus will be held for 4 weeks and  thus far, it is  negative.  As mentioned earlier, the patient underwent subsequently a biopsy of the occipital lesion by  neurosurgery.  Dr. Newell Coral  had performed this procedure on the 16th of May and he was told that the  specimens have been sent to [facility] and the results will not be back at  least for a week.  Per his information when he looked at it, there was more  just lymphocytic predominance.  Based on this information, it was felt by  all parties taking care of her that she was stable for rehab.  Rehab had  been consulted from the time when she first came in and they found her a  good candidate for rehab and the patient is being transferred to the rehab  service for continued rehab.   During her workup for these brain lesions, looking for a primary cancer, her  CT scan of the chest done with contrast came back showing nodularities in  her thyroid; for this, the patient underwent thyroid function tests and they  have all been fairly unremarkable; they have all actually been in the normal  range.  Because she has received contrast  material for her CT studies, radiology is unable to perform her thyroid scan  and this needs to be done in approximately 6 weeks to further work up these  thyroid nodules.  At the time of discharge from the [facility], the patient is stable and is stable for discharge to the rehab  service.                                                Isla Pence, M.D.    RRV/MEDQ  D:  03/07/2004  T:  03/07/2004  Job:  [id number]   cc:   Hal T. Stoneking, M.D.  [address] [place], [place] [address]  Fax: [phone]   Pramod P. Pearlean Brownie, MD  Fax: [phone]   Hewitt Shorts, M.D.  [address]  [place]  [place] [address]  Fax: [phone]

## 2011-08-05 ENCOUNTER — Other Ambulatory Visit (HOSPITAL_COMMUNITY): Payer: Self-pay | Admitting: Oral and Maxillofacial Surgery

## 2011-08-05 DIAGNOSIS — R6884 Jaw pain: Secondary | ICD-10-CM

## 2011-08-09 ENCOUNTER — Ambulatory Visit (HOSPITAL_COMMUNITY)
Admission: RE | Admit: 2011-08-09 | Discharge: 2011-08-09 | Disposition: A | Discharge status: Home / Self Care | Payer: Medicare Other | Source: Ambulatory Visit | Attending: Oral and Maxillofacial Surgery | Admitting: Oral and Maxillofacial Surgery

## 2011-08-09 DIAGNOSIS — M26629 Arthralgia of temporomandibular joint, unspecified side: Secondary | ICD-10-CM | POA: Insufficient documentation

## 2011-08-09 DIAGNOSIS — R6884 Jaw pain: Secondary | ICD-10-CM

## 2011-08-10 ENCOUNTER — Other Ambulatory Visit (HOSPITAL_COMMUNITY): Payer: Self-pay

## 2011-09-28 ENCOUNTER — Ambulatory Visit (INDEPENDENT_AMBULATORY_CARE_PROVIDER_SITE_OTHER): Payer: Medicare Other

## 2011-09-28 DIAGNOSIS — R1031 Right lower quadrant pain: Secondary | ICD-10-CM

## 2011-11-01 ENCOUNTER — Other Ambulatory Visit: Payer: Self-pay | Admitting: Geriatric Medicine

## 2011-11-01 DIAGNOSIS — Z1231 Encounter for screening mammogram for malignant neoplasm of breast: Secondary | ICD-10-CM

## 2011-11-25 ENCOUNTER — Ambulatory Visit: Payer: Medicare Other

## 2012-02-07 ENCOUNTER — Ambulatory Visit
Admission: RE | Admit: 2012-02-07 | Discharge: 2012-02-07 | Disposition: A | Discharge status: Home / Self Care | Payer: Medicare Other | Source: Ambulatory Visit | Attending: Geriatric Medicine | Admitting: Geriatric Medicine

## 2012-02-07 DIAGNOSIS — Z1231 Encounter for screening mammogram for malignant neoplasm of breast: Secondary | ICD-10-CM

## 2013-01-04 ENCOUNTER — Other Ambulatory Visit: Payer: Self-pay

## 2013-02-10 ENCOUNTER — Ambulatory Visit
Admission: RE | Admit: 2013-02-10 | Discharge: 2013-02-10 | Disposition: A | Discharge status: Home / Self Care | Payer: Medicare Other | Source: Ambulatory Visit

## 2013-02-10 DIAGNOSIS — Z1231 Encounter for screening mammogram for malignant neoplasm of breast: Secondary | ICD-10-CM

## 2013-04-04 ENCOUNTER — Encounter (HOSPITAL_COMMUNITY): Payer: Self-pay | Admitting: Emergency Medicine

## 2013-04-04 ENCOUNTER — Inpatient Hospital Stay (HOSPITAL_COMMUNITY)
Admission: EM | Admit: 2013-04-04 | Discharge: 2013-04-06 | DRG: 918 | Disposition: A | Discharge status: Home / Self Care | Payer: Medicare Other | Attending: Family Medicine | Admitting: Family Medicine

## 2013-04-04 DIAGNOSIS — Z79899 Other long term (current) drug therapy: Secondary | ICD-10-CM

## 2013-04-04 DIAGNOSIS — R001 Bradycardia, unspecified: Secondary | ICD-10-CM

## 2013-04-04 DIAGNOSIS — F0390 Unspecified dementia without behavioral disturbance: Secondary | ICD-10-CM | POA: Diagnosis present

## 2013-04-04 DIAGNOSIS — F411 Generalized anxiety disorder: Secondary | ICD-10-CM | POA: Diagnosis present

## 2013-04-04 DIAGNOSIS — F3340 Major depressive disorder, recurrent, in remission, unspecified: Secondary | ICD-10-CM

## 2013-04-04 DIAGNOSIS — R5381 Other malaise: Secondary | ICD-10-CM | POA: Diagnosis present

## 2013-04-04 DIAGNOSIS — I498 Other specified cardiac arrhythmias: Secondary | ICD-10-CM | POA: Diagnosis present

## 2013-04-04 DIAGNOSIS — R45851 Suicidal ideations: Secondary | ICD-10-CM

## 2013-04-04 DIAGNOSIS — T43294A Poisoning by other antidepressants, undetermined, initial encounter: Principal | ICD-10-CM | POA: Diagnosis present

## 2013-04-04 DIAGNOSIS — R55 Syncope and collapse: Secondary | ICD-10-CM

## 2013-04-04 DIAGNOSIS — E039 Hypothyroidism, unspecified: Secondary | ICD-10-CM | POA: Diagnosis present

## 2013-04-04 DIAGNOSIS — R4182 Altered mental status, unspecified: Secondary | ICD-10-CM | POA: Diagnosis present

## 2013-04-04 DIAGNOSIS — F339 Major depressive disorder, recurrent, unspecified: Secondary | ICD-10-CM | POA: Diagnosis present

## 2013-04-04 HISTORY — DX: Disorder of thyroid, unspecified: E07.9

## 2013-04-04 LAB — CBC WITH DIFFERENTIAL/PLATELET
Basophils Absolute: 0 10*3/uL (ref 0.0–0.1)
Eosinophils Absolute: 0 10*3/uL (ref 0.0–0.7)
Eosinophils Relative: 0 % (ref 0–5)
HCT: 39.8 % (ref 36.0–46.0)
Lymphocytes Relative: 5 % — ABNORMAL LOW (ref 12–46)
MCH: 29.2 pg (ref 26.0–34.0)
MCHC: 35.2 g/dL (ref 30.0–36.0)
MCV: 83.1 fL (ref 78.0–100.0)
Monocytes Absolute: 0.2 10*3/uL (ref 0.1–1.0)
Platelets: 191 10*3/uL (ref 150–400)
RDW: 13.2 % (ref 11.5–15.5)

## 2013-04-04 LAB — RAPID URINE DRUG SCREEN, HOSP PERFORMED
Amphetamines: NOT DETECTED
Benzodiazepines: NOT DETECTED
Cocaine: NOT DETECTED
Opiates: NOT DETECTED

## 2013-04-04 LAB — COMPREHENSIVE METABOLIC PANEL
ALT: 23 U/L (ref 0–35)
AST: 32 U/L (ref 0–37)
CO2: 24 mEq/L (ref 19–32)
Calcium: 10.1 mg/dL (ref 8.4–10.5)
Creatinine, Ser: 0.76 mg/dL (ref 0.50–1.10)
GFR calc Af Amer: 90 mL/min — ABNORMAL LOW (ref >90)
GFR calc non Af Amer: 78 mL/min — ABNORMAL LOW (ref >90)
Glucose, Bld: 105 mg/dL — ABNORMAL HIGH (ref 70–99)
Sodium: 137 mEq/L (ref 135–145)
Total Protein: 7.3 g/dL (ref 6.0–8.3)

## 2013-04-04 LAB — URINE MICROSCOPIC-ADD ON

## 2013-04-04 LAB — URINALYSIS, ROUTINE W REFLEX MICROSCOPIC
Nitrite: NEGATIVE
Specific Gravity, Urine: 1.022 (ref 1.005–1.030)
Urobilinogen, UA: 0.2 mg/dL (ref 0.0–1.0)
pH: 7.5 (ref 5.0–8.0)

## 2013-04-04 LAB — ACETAMINOPHEN LEVEL: Acetaminophen (Tylenol), Serum: <15 ug/mL (ref 10–30)

## 2013-04-04 LAB — SALICYLATE LEVEL: Salicylate Lvl: <2 mg/dL — ABNORMAL LOW (ref 2.8–20.0)

## 2013-04-04 LAB — ETHANOL: Alcohol, Ethyl (B): <11 mg/dL (ref 0–11)

## 2013-04-04 MED ORDER — WHITE PETROLATUM GEL
Status: AC
  Filled 2013-04-04: qty 5

## 2013-04-04 MED ORDER — ALUM & MAG HYDROXIDE-SIMETH 200-200-20 MG/5ML PO SUSP: 30.0000 mL | ORAL | Status: DC | PRN

## 2013-04-04 MED ORDER — NICOTINE 21 MG/24HR TD PT24
21.0000 mg | MEDICATED_PATCH | Freq: Every day | TRANSDERMAL | Status: DC
  Filled 2013-04-04 (×2): qty 1

## 2013-04-04 MED ORDER — ACETAMINOPHEN 325 MG PO TABS: 650.0000 mg | ORAL_TABLET | ORAL | Status: DC | PRN

## 2013-04-04 MED ORDER — ONDANSETRON HCL 4 MG PO TABS: 4.0000 mg | ORAL_TABLET | Freq: Three times a day (TID) | ORAL | Status: DC | PRN

## 2013-04-04 MED ORDER — IBUPROFEN 200 MG PO TABS: 600.0000 mg | ORAL_TABLET | Freq: Three times a day (TID) | ORAL | Status: DC | PRN

## 2013-04-04 MED ORDER — LORAZEPAM 1 MG PO TABS
1.0000 mg | ORAL_TABLET | Freq: Three times a day (TID) | ORAL | Status: DC | PRN
  Administered 2013-04-04: 1 mg via ORAL
  Filled 2013-04-04: qty 1

## 2013-04-04 NOTE — ED Notes (Addendum)
Pt here via ems from [facility] attempted suicide with scheduled daily meds Paxil,Synthroid questionable amt.Pt satated that she did not want live anymore after being told she had Alzheimers dz and she cannot drive her car anymore to see her son who is mentally retarded

## 2013-04-04 NOTE — ED Provider Notes (Signed)
Patient vomited. Will monitor.  Juliet Rude. Rubin Payor, MD 04/04/13 1723

## 2013-04-04 NOTE — ED Notes (Signed)
RUE:[id number]<JW> Expected date:<BR> Expected time:<BR> Means of arrival:<BR> Comments:<BR> ems- unsure of encode, unable to understand

## 2013-04-04 NOTE — ED Provider Notes (Signed)
History     CSN: [id number]  Arrival date & time 04/04/13  1336   First MD Initiated Contact with Patient 04/04/13 1401      Chief Complaint  Patient presents with  . Suicidal    (Consider location/radiation/quality/duration/timing/severity/associated sxs/prior treatment) The history is provided by the patient.   Patient complaining of depression with suicide attempt consisting of taken 10 or 12 Paxil tablets yesterday. She is upset because she recently lost her driving privileges due to her advancing dementia. Patient denies any other ingestions at this time. No prior history of suicide attempt. Denies any recent fever chills or vomiting. She took the medications last night. Past Medical History  Diagnosis Date  . Thyroid disease     Past Surgical History  Procedure Laterality Date  . Lt catarract      No family history on file.  History  Substance Use Topics  . Smoking status: Never Smoker   . Smokeless tobacco: Not on file  . Alcohol Use: No    OB History   Grav Para Term Preterm Abortions TAB SAB Ect Mult Living                  Review of Systems  All other systems reviewed and are negative.    Allergies  Review of patient's allergies indicates no known allergies.  Home Medications   Current Outpatient Rx  Name  Route  Sig  Dispense  Refill  . levothyroxine (SYNTHROID, LEVOTHROID) 50 MCG tablet   Oral   Take 50 mcg by mouth daily before breakfast.         . PARoxetine (PAXIL) 10 MG tablet   Oral   Take 10-20 mg by mouth daily. Take 10mg  in the morning and 20mg  at bedtime           BP 148/64  Pulse 67  Temp(Src) 98.5 F (36.9 C) (Oral)  Resp 18  SpO2 100%  Physical Exam  Nursing note and vitals reviewed. Constitutional: She is oriented to person, place, and time. She appears well-developed and well-nourished.  Non-toxic appearance. No distress.  HENT:  Head: Normocephalic and atraumatic.  Eyes: Conjunctivae, EOM and lids are normal.  Pupils are equal, round, and reactive to light.  Neck: Normal range of motion. Neck supple. No tracheal deviation present. No mass present.  Cardiovascular: Normal rate, regular rhythm and normal heart sounds.  Exam reveals no gallop.   No murmur heard. Pulmonary/Chest: Effort normal and breath sounds normal. No stridor. No respiratory distress. She has no decreased breath sounds. She has no wheezes. She has no rhonchi. She has no rales.  Abdominal: Soft. Normal appearance and bowel sounds are normal. She exhibits no distension. There is no tenderness. There is no rebound and no CVA tenderness.  Musculoskeletal: Normal range of motion. She exhibits no edema and no tenderness.  Neurological: She is alert and oriented to person, place, and time. She has normal strength. No cranial nerve deficit or sensory deficit. GCS eye subscore is 4. GCS verbal subscore is 5. GCS motor subscore is 6.  Skin: Skin is warm and dry. No abrasion and no rash noted.  Psychiatric: Her affect is blunt. Her speech is delayed. She is withdrawn. She expresses suicidal ideation. She expresses suicidal plans.    ED Course  Procedures (including critical care time)  Labs Reviewed  ETHANOL  URINE RAPID DRUG SCREEN (HOSP PERFORMED)  CBC WITH DIFFERENTIAL  COMPREHENSIVE METABOLIC PANEL  URINALYSIS, ROUTINE W REFLEX MICROSCOPIC  SALICYLATE LEVEL  ACETAMINOPHEN LEVEL   No results found.   No diagnosis found.    MDM  Patient is to be medically cleared and seen by psychiatry. Discussed the case with the psychiatric nurse practitioner as well as the behavior health assessment team and patient will be seen        Toy Baker, MD 04/04/13 1443

## 2013-04-04 NOTE — BH Assessment (Signed)
Assessment Note   Theresa Robinson is an 79 y.o. female. Pt is a pleasant, Caucasian 79 yo woman who presents with intentional overdose. Pt says, "this (behavior) isn't like me" and "I've always been an upbeat person". Pt says Theresa Robinson became overwhelmed last night and took several pills with intention of killing herself. Theresa Robinson states Theresa Robinson has been under a lot of stress lately. Theresa Robinson was recently dx with Alzheimer's and is no longer allowed to drive. Pt says Theresa Robinson visited her 35 yo son who has MR at [facility] at [place] every other week until her driving privileges were taken away. Theresa Robinson says Theresa Robinson misses her son terribly. Theresa Robinson says, "I just got bogged down". Pt lives at [facility]. Pt currently denies SI. Theresa Robinson is future oriented and Clinical research associate discussed with patient strategies to find transportation to see her son. Pt says that Theresa Robinson hates to ask for help but Theresa Robinson knows that Theresa Robinson has friends at [facility] who would be willing to take her to see son. Pt denies HI. Theresa Robinson denies [facility] and no delusions noted. Pt states Theresa Robinson can contract for safety and Theresa Robinson would like to go back to [facility]. Telepsych consult will be ordered.   Axis I: Depressive Disorder NOS Axis II: Deferred Axis III:  Past Medical History  Diagnosis Date  . Thyroid disease    Axis IV: other psychosocial or environmental problems, problems related to social environment and problems with primary support group Axis V: 41-50 serious symptoms  Past Medical History:  Past Medical History  Diagnosis Date  . Thyroid disease     Past Surgical History  Procedure Laterality Date  . Lt catarract      Family History: No family history on file.  Social History:  reports that Theresa Robinson has never smoked. Theresa Robinson does not have any smokeless tobacco history on file. Theresa Robinson reports that Theresa Robinson does not drink alcohol or use illicit drugs.  Additional Social History:  Alcohol / Drug Use Pain Medications: see PTA meds list Prescriptions: see PTA meds list Over the Counter: see PTA  meds list History of alcohol / drug use?: No history of alcohol / drug abuse Longest period of sobriety (when/how long): n/a  CIWA: CIWA-Ar BP: 148/64 mmHg Pulse Rate: 67 COWS:    Allergies: No Known Allergies  Home Medications:  (Not in a hospital admission)  OB/GYN Status:  No LMP recorded. Patient is postmenopausal.  General Assessment Data Location of Assessment: [facility] ED Living Arrangements: Other (Comment) ([facility]) Can pt return to current living arrangement?: Yes Admission Status: Voluntary Is patient capable of signing voluntary admission?: Yes Transfer from: Other (Comment) (assisted living ) Referral Source: Self/Family/Friend  Education Status Is patient currently in school?: No Current Grade: na Highest grade of school patient has completed: 12  Risk to self Suicidal Ideation: No Suicidal Intent: No Is patient at risk for suicide?: No Suicidal Plan?: No (pt currently denies SI, intent or plan) Access to Means: Yes Specify Access to Suicidal Means: pills What has been your use of drugs/alcohol within the last 12 months?: none Previous Attempts/Gestures: Yes How many times?: 1 (attempted overdose 04/03/13) Other Self Harm Risks: none Triggers for Past Attempts: Other (Comment) (dx of Alzheimers) Intentional Self Injurious Behavior: None Family Suicide History: No (father was alcoholic) Recent stressful life event(s): Recent negative physical changes (alzhiemer's dx, loss of driving privileges, can't visit son) Persecutory voices/beliefs?: No Depression: No Depression Symptoms:  (pt denies depressive symptosm) Substance abuse history and/or treatment for substance abuse?: No  Suicide prevention information given to non-admitted patients: Not applicable  Risk to Others Homicidal Ideation: No Thoughts of Harm to Others: No Current Homicidal Intent: No Current Homicidal Plan: No Access to Homicidal Means: No Identified Victim: none History of harm to  others?: No Assessment of Violence: None Noted Violent Behavior Description: pt calm and pleasant Does patient have access to weapons?: No Criminal Charges Pending?: No Does patient have a court date: No  Psychosis Hallucinations: None noted Delusions: None noted  Mental Status Report Appear/Hygiene: Other (Comment) (appropriate, wig is lying beside bed) Eye Contact: Good Motor Activity: Freedom of movement Speech: Logical/coherent;Soft Level of Consciousness: Alert Mood: Sad;Other (Comment) (temporarily sad) Affect: Appropriate to circumstance Anxiety Level: None Thought Processes: Coherent;Relevant Judgement: Unimpaired Orientation: Person;Place;Time;Situation Obsessive Compulsive Thoughts/Behaviors: None  Cognitive Functioning Concentration: Normal Memory: Remote Impaired;Recent Impaired IQ: Average Insight: Good Impulse Control: Poor Appetite: Good Weight Loss: 0 Weight Gain: 0 Sleep: No Change Total Hours of Sleep: 7 Vegetative Symptoms: None  ADLScreening [facility]) Patient's cognitive ability adequate to safely complete daily activities?: Yes Patient able to express need for assistance with ADLs?: Yes Independently performs ADLs?: Yes (appropriate for developmental age)  Abuse/Neglect [facility]) Physical Abuse: Denies Verbal Abuse: Denies Sexual Abuse: Denies  Prior Inpatient Therapy Prior Inpatient Therapy: No Prior Therapy Dates: na Prior Therapy Facilty/Provider(s): na Reason for Treatment: na  Prior Outpatient Therapy Prior Outpatient Therapy: No Prior Therapy Dates: na Prior Therapy Facilty/Provider(s): na Reason for Treatment: na  ADL Screening (condition at time of admission) Patient's cognitive ability adequate to safely complete daily activities?: Yes Patient able to express need for assistance with ADLs?: Yes Independently performs ADLs?: Yes (appropriate for developmental age) Weakness of Legs: None Weakness of Arms/Hands:  None       Abuse/Neglect Assessment (Assessment to be complete while patient is alone) Physical Abuse: Denies Verbal Abuse: Denies Sexual Abuse: Denies Exploitation of patient/patient's resources: Denies Self-Neglect: Denies Values / Beliefs Cultural Requests During Hospitalization: None Spiritual Requests During Hospitalization: None   Advance Directives (For Healthcare) Advance Directive: Patient has advance directive, copy not in chart Type of Advance Directive: Living will Advance Directive not in Chart:  (copy will be requested if pt isn't discharged)    Additional Information 1:1 In Past 12 Months?: No CIRT Risk: No Elopement Risk: No Does patient have medical clearance?: Yes     Disposition:  Disposition Initial Assessment Completed for this Encounter: Yes Disposition of Patient: Other dispositions (pending telepsych w/ Dr. Jacky Kindle)  On Site Evaluation by:   Reviewed with Physician:     Donnamarie Rossetti P 04/04/2013 11:16 PM

## 2013-04-05 ENCOUNTER — Emergency Department (HOSPITAL_COMMUNITY): Payer: Medicare Other

## 2013-04-05 DIAGNOSIS — R55 Syncope and collapse: Secondary | ICD-10-CM

## 2013-04-05 DIAGNOSIS — R45851 Suicidal ideations: Secondary | ICD-10-CM

## 2013-04-05 DIAGNOSIS — F32A Depression, unspecified: Secondary | ICD-10-CM

## 2013-04-05 DIAGNOSIS — I498 Other specified cardiac arrhythmias: Secondary | ICD-10-CM

## 2013-04-05 HISTORY — DX: Depression, unspecified: F32.A

## 2013-04-05 LAB — TSH: TSH: 2.385 u[IU]/mL (ref 0.350–4.500)

## 2013-04-05 LAB — GLUCOSE, CAPILLARY: Glucose-Capillary: 101 mg/dL — ABNORMAL HIGH (ref 70–99)

## 2013-04-05 LAB — POCT I-STAT TROPONIN I: Troponin i, poc: 0.01 ng/mL (ref 0.00–0.08)

## 2013-04-05 LAB — TROPONIN I: Troponin I: <0.3 ng/mL (ref <0.30)

## 2013-04-05 MED ORDER — PAROXETINE HCL 20 MG PO TABS
20.0000 mg | ORAL_TABLET | Freq: Every day | ORAL | Status: DC
  Filled 2013-04-05: qty 1

## 2013-04-05 MED ORDER — PAROXETINE HCL 10 MG PO TABS
10.0000 mg | ORAL_TABLET | Freq: Every day | ORAL | Status: DC
  Filled 2013-04-05: qty 2

## 2013-04-05 MED ORDER — ENOXAPARIN SODIUM 30 MG/0.3ML ~~LOC~~ SOLN
30.0000 mg | SUBCUTANEOUS | Status: DC
  Administered 2013-04-05: 30 mg via SUBCUTANEOUS
  Filled 2013-04-05 (×2): qty 0.3

## 2013-04-05 MED ORDER — HYDROCODONE-ACETAMINOPHEN 5-325 MG PO TABS: 1.0000 | ORAL_TABLET | ORAL | Status: DC | PRN

## 2013-04-05 MED ORDER — SODIUM CHLORIDE 0.9 % IJ SOLN
3.0000 mL | Freq: Two times a day (BID) | INTRAMUSCULAR | Status: DC
  Administered 2013-04-05 (×2): 3 mL via INTRAVENOUS

## 2013-04-05 MED ORDER — ONDANSETRON HCL 4 MG/2ML IJ SOLN: 4.0000 mg | Freq: Four times a day (QID) | INTRAMUSCULAR | Status: DC | PRN

## 2013-04-05 MED ORDER — LEVOTHYROXINE SODIUM 50 MCG PO TABS
50.0000 ug | ORAL_TABLET | Freq: Every day | ORAL | Status: DC
  Administered 2013-04-05 – 2013-04-06 (×2): 50 ug via ORAL
  Filled 2013-04-05 (×3): qty 1

## 2013-04-05 MED ORDER — ATROPINE SULFATE 0.1 MG/ML IJ SOLN
INTRAMUSCULAR | Status: AC
  Filled 2013-04-05: qty 10

## 2013-04-05 MED ORDER — PAROXETINE HCL 10 MG PO TABS
10.0000 mg | ORAL_TABLET | Freq: Every day | ORAL | Status: DC
  Administered 2013-04-05: 10 mg via ORAL
  Filled 2013-04-05: qty 1

## 2013-04-05 MED ORDER — ONDANSETRON HCL 4 MG PO TABS: 4.0000 mg | ORAL_TABLET | Freq: Four times a day (QID) | ORAL | Status: DC | PRN

## 2013-04-05 NOTE — ED Notes (Signed)
Pt had syncope episode after completing tele psych consult. Pt became very unresponsive and clammy. Attempt to arouse patient by calling her name and sternal rub.  Heart rate was in 30's.

## 2013-04-05 NOTE — H&P (Signed)
[facility] History and Physical  Theresa Robinson:[id number] DOB: 12-Sep-1931 DOA: 04/04/2013  Referring physician: ED physician PCP: Ginette Otto, MD   Chief Complaint: suicide attempt   HPI:  Patient is 79 year old female who presents to [facility] emergency department after attempted suicide attempt, reportedly took 10-12 tablets of Paxil. Patient was unable to provide history on admission as she was lethargic and due to altered mental status was unable to explain it she has had any specific reason for this attempt. In emergency department, psychiatry evaluation was required and placement to behavioral health department requested however, patient found to have bradycardia with heart rate in 30s and [facility] asked to admit for further evaluation. Patient this morning explains she was upset that she has lost her driver license because of her dementia and as a result she took Paxil. She denies chest pain or shortness of breath this time, no abdominal or urinary concerns, no specific focal neurological symptoms, no other systemic concerns. She denies abdominal or urinary concerns. In addition she denies suicidal ideations at this time.  Assessment and Plan:  Suicide attempt - Will admit patient to telemetry bed at this time, we'll monitor her vital signs closely - Psych consultation requested for further evaluation and management - Appreciate input, continue sitter at bedside Bradycardia  - Unclear etiology, possibly related to overdose of Paxil - Will check TSH, cardiac enzymes, 12-lead EKG - Currently heart rate is in 60s, we'll continue to monitor on telemetry Hypothyroidism  - Will check TSH, continue Synthroid as per home medical regimen  Code Status: Full Family Communication: Pt at bedside Disposition Plan: Admit to telemetry unit   Review of Systems:  Constitutional: Negative for fever, chills and malaise/fatigue. Negative for diaphoresis.  HENT: Negative for  hearing loss, ear pain, nosebleeds, congestion, sore throat, neck pain, tinnitus and ear discharge.   Eyes: Negative for blurred vision, double vision, photophobia, pain, discharge and redness.  Respiratory: Negative for cough, hemoptysis, sputum production, shortness of breath, wheezing and stridor.   Cardiovascular: Negative for chest pain, palpitations, orthopnea, claudication and leg swelling.  Gastrointestinal: Negative for nausea, vomiting and abdominal pain. Negative for heartburn, constipation, blood in stool and melena.  Genitourinary: Negative for dysuria, urgency, frequency, hematuria and flank pain.  Musculoskeletal: Negative for myalgias, back pain, joint pain and falls.  Skin: Negative for itching and rash.  Neurological: Negative for dizziness and weakness. Negative for tingling, tremors, sensory change, speech change, focal weakness, loss of consciousness and headaches.  Endo/Heme/Allergies: Negative for environmental allergies and polydipsia. Does not bruise/bleed easily.  Psychiatric/Behavioral: The patient is not nervous/anxious.    Past Medical History  Diagnosis Date  . Thyroid disease     Past Surgical History  Procedure Laterality Date  . Lt catarract      Social History:  reports that she has never smoked. She does not have any smokeless tobacco history on file. She reports that she does not drink alcohol or use illicit drugs.  No Known Allergies  No known medical problems in family.   Prior to Admission medications   Medication Sig Start Date End Date Taking? Authorizing Provider  levothyroxine (SYNTHROID, LEVOTHROID) 50 MCG tablet Take 50 mcg by mouth daily before breakfast.   Yes Historical Provider, MD  PARoxetine (PAXIL) 10 MG tablet Take 10-20 mg by mouth daily. Take 10mg  in the morning and 20mg  at bedtime   Yes Historical Provider, MD    Physical Exam: Filed Vitals:   04/04/13 1358 04/24/13 0228 2013-04-24  0341 26-Apr-2013 0445  BP: 148/64 105/55 97/50  137/56  Pulse: 67  55 66  Temp: 98.5 F (36.9 C)  98.5 F (36.9 C)   TempSrc: Oral  Oral   Resp: 18 14 18 17   SpO2: 100% 93% 100% 100%    Physical Exam  Constitutional: Appears well-developed and well-nourished. No distress.  HENT: Normocephalic. External right and left ear normal. Oropharynx is clear and moist.  Eyes: Conjunctivae and EOM are normal. PERRLA, no scleral icterus.  Neck: Normal ROM. Neck supple. No JVD. No tracheal deviation. No thyromegaly.  CVS: RRR, S1/S2 +, no murmurs, no gallops, no carotid bruit.  Pulmonary: Effort and breath sounds normal, no stridor, rhonchi, wheezes, rales.  Abdominal: Soft. BS +,  no distension, tenderness, rebound or guarding.  Musculoskeletal: Normal range of motion. No edema and no tenderness.  Lymphadenopathy: No lymphadenopathy noted, cervical, inguinal. Neuro: Alert. Normal reflexes, muscle tone coordination. No cranial nerve deficit. Skin: Skin is warm and dry. No rash noted. Not diaphoretic. No erythema. No pallor.  Psychiatric: Flat affect, denies suicidal ideations at this time  Labs on Admission:  Basic Metabolic Panel:  Recent Labs Lab 04/04/13 1400  NA 137  K 3.9  CL 101  CO2 24  GLUCOSE 105*  BUN 16  CREATININE 0.76  CALCIUM 10.1   Liver Function Tests:  Recent Labs Lab 04/04/13 1400  AST 32  ALT 23  ALKPHOS 99  BILITOT 0.4  PROT 7.3  ALBUMIN 4.1   CBC:  Recent Labs Lab 04/04/13 1400  WBC 7.5  NEUTROABS 6.9  HGB 14.0  HCT 39.8  MCV 83.1  PLT 191   CBG:  Recent Labs Lab 04/26/2013 0326  GLUCAP 101*   Radiological Exams on Admission: Ct Head Wo Contrast 26-Apr-2013  -->  No acute intracranial abnormalities. Mild chronic microvascular ischemic changes in the cerebral white matter and postoperative changes. Dg Chest Portable 1 View  04/26/13 --> No radiographic evidence of acute cardiopulmonary disease.    EKG: Normal sinus rhythm, no ST/T wave changes  Debbora Presto, MD  [facility] Pager [phone]  If 7PM-7AM, please contact night-coverage [email or web address] Password [facility] 04-26-2013, 8:33 AM

## 2013-04-05 NOTE — ED Provider Notes (Signed)
0981: While getting psychiatry consultation, patient became unresponsive and heart rate dropped down to 34. She never lost pulses are became apneic. There is no bowel or bladder incontinence. Episode has now passed and heart rate is back up into the 60s. She is now awake and alert. She did not get any medications. I am suspicious that she may have had a seizure. Electrolytes were normal. She will ge an ECG, chest x-ray, CT of the head, and lactic acid levels checked. She will need medical admission.   Date: 04/06/2013  Rate: 55  Rhythm: sinus bradycardia  QRS Axis: normal  Intervals: normal  ST/T Wave abnormalities: nonspecific T wave changes  Conduction Disutrbances:none  Narrative Interpretation:  Sinus bradycardia, nonspecific T wave changes. When compared with ECG of Feb 25 2004, no significant changes are seen.  Old EKG Reviewed: unchanged  CT is unremarkable as is troponin lactic acid level. She is continued to be hemodynamically stable since the original episode noted above. Case is discussed with Dr. Julian Reil [facility] who agrees to admit the patient. At this point, still need to consider possibility of seizure and postictal state,. Also possible primary arrhythmia with bradycardia from sick sinus syndrome.  RN has gone into evaluated the patient and found that she had been incontinent of urine. This makes seizure a more likely diagnosis. However, it is possible that a seizure would've occurred secondary to cerebral hypoperfusion from bradycardia.   CRITICAL CARE Performed by: Dione Booze Total critical care time: 35 minutes Critical care time was exclusive of separately billable procedures and treating other patients. Critical care was necessary to treat or prevent imminent or life-threatening deterioration. Critical care was time spent personally by me on the following activities: development of treatment plan with patient and/or surrogate as well as nursing, discussions with  consultants, evaluation of patient's response to treatment, examination of patient, obtaining history from patient or surrogate, ordering and performing treatments and interventions, ordering and review of laboratory studies, ordering and review of radiographic studies, pulse oximetry and re-evaluation of patient's condition.   Dione Booze, MD April 06, 2013 [phone]

## 2013-04-05 NOTE — Care Management Note (Addendum)
    Page 1 of 1   04/07/2013     11:33:12 AM   CARE MANAGEMENT NOTE 04/07/2013  Patient:  Theresa Robinson, Theresa Robinson   Account Number:  [id number]  Date Initiated:  04-12-13  Documentation initiated by:  Lanier Clam  Subjective/Objective Assessment:   ADMITTED W/SUICIDE ATTEMPT-PSYCH-[facility],BRADYCARDIA,SYNCOPE.     Action/Plan:   FROM [facility].   Anticipated DC Date:  04/06/2013   Anticipated DC Plan:  [facility]      DC Planning Services  CM consult      Choice offered to / List presented to:             Status of service:  Completed, signed off Medicare Important Message given?   (If response is "NO", the following Medicare IM given date fields will be blank) Date Medicare IM given:   Date Additional Medicare IM given:    Discharge Disposition:  HOME/SELF CARE  Per UR Regulation:  Reviewed for med. necessity/level of care/duration of stay  If discussed at Long Length of Stay Meetings, dates discussed:    Comments:  04/06/13 Emmry Hinsch RN,BSN NCM [phone] PSYCH-NOT APPROPRIATE FOR INPT PSYCH.D/C HOME YESTERDAY.  April 12, 2013 Kalianna Verbeke RN,BSN NCM [phone] PSYCH SW NOTIFIED.

## 2013-04-05 NOTE — Consult Note (Signed)
Reason for Consult: depression and suicidal attempt Referring Physician: Dr. Marianna Fuss is an 79 y.o. female.  HPI: Patient is a seen and chart reviewed. Patient reported she has taken then to while tablets of Paxil with the intention to end her life instead of giving up her freedom, was also suffering with memory loss and depending on other people. She also has been stressed about her son who was in [facility] for long-term hospitalization. Patient explains she was upset that she has lost her driver license because of her dementia. Patient adamantly denies current suicidal ideations and regrets medication overdose. Patient contracts for safety and she has no history of mental health problems in the past that required inpatient hospitalization. Patient has no history of substance abuse or alcoholism. She is an independent apartment where she can Access to assisted living facility if she decides to.   Mental Status Examination: Patient appeared as per his stated age, casually dressed, and fairly groomed, and maintaining good eye contact. Patient has good mood and his affect was bright and appropriate. She has normal rate, rhythm, and volume of speech. His thought process is linear and goal directed. Patient has denied suicidal, homicidal ideations, intentions or plans. Patient has no evidence of auditory or visual hallucinations, delusions, and paranoia. Patient has fair insight judgment and impulse control.  Past Medical History  Diagnosis Date  . Thyroid disease     Past Surgical History  Procedure Laterality Date  . Lt catarract      No family history on file.  Social History:  reports that she has never smoked. She does not have any smokeless tobacco history on file. She reports that she does not drink alcohol or use illicit drugs.  Allergies: No Known Allergies  Medications: I have reviewed the patient's current medications.  Results for orders placed during the  hospital encounter of 04/04/13 (from the past 48 hour(s))  ETHANOL     Status: None   Collection Time    04/04/13  2:00 PM      Result Value Range   Alcohol, Ethyl (B) <11  0 - 11 mg/dL   Comment:            LOWEST DETECTABLE LIMIT FOR     SERUM ALCOHOL IS 11 mg/dL     FOR MEDICAL PURPOSES ONLY  CBC WITH DIFFERENTIAL     Status: Abnormal   Collection Time    04/04/13  2:00 PM      Result Value Range   WBC 7.5  4.0 - 10.5 K/uL   RBC 4.79  3.87 - 5.11 MIL/uL   Hemoglobin 14.0  12.0 - 15.0 g/dL   HCT 16.1  09.6 - 04.5 %   MCV 83.1  78.0 - 100.0 fL   MCH 29.2  26.0 - 34.0 pg   MCHC 35.2  30.0 - 36.0 g/dL   RDW 40.9  81.1 - 91.4 %   Platelets 191  150 - 400 K/uL   Neutrophils Relative % 92 (*) 43 - 77 %   Neutro Abs 6.9  1.7 - 7.7 K/uL   Lymphocytes Relative 5 (*) 12 - 46 %   Lymphs Abs 0.4 (*) 0.7 - 4.0 K/uL   Monocytes Relative 3  3 - 12 %   Monocytes Absolute 0.2  0.1 - 1.0 K/uL   Eosinophils Relative 0  0 - 5 %   Eosinophils Absolute 0.0  0.0 - 0.7 K/uL   Basophils Relative 0  0 - 1 %   Basophils Absolute 0.0  0.0 - 0.1 K/uL  COMPREHENSIVE METABOLIC PANEL     Status: Abnormal   Collection Time    04/04/13  2:00 PM      Result Value Range   Sodium 137  135 - 145 mEq/L   Potassium 3.9  3.5 - 5.1 mEq/L   Chloride 101  96 - 112 mEq/L   CO2 24  19 - 32 mEq/L   Glucose, Bld 105 (*) 70 - 99 mg/dL   BUN 16  6 - 23 mg/dL   Creatinine, Ser 1.61  0.50 - 1.10 mg/dL   Calcium 09.6  8.4 - 04.5 mg/dL   Total Protein 7.3  6.0 - 8.3 g/dL   Albumin 4.1  3.5 - 5.2 g/dL   AST 32  0 - 37 U/L   ALT 23  0 - 35 U/L   Alkaline Phosphatase 99  39 - 117 U/L   Total Bilirubin 0.4  0.3 - 1.2 mg/dL   GFR calc non Af Amer 78 (*) >90 mL/min   GFR calc Af Amer 90 (*) >90 mL/min   Comment:            The eGFR has been calculated     using the CKD EPI equation.     This calculation has not been     validated in all clinical     situations.     eGFR's persistently     <90 mL/min signify      possible Chronic Kidney Disease.  SALICYLATE LEVEL     Status: Abnormal   Collection Time    04/04/13  2:00 PM      Result Value Range   Salicylate Lvl <2.0 (*) 2.8 - 20.0 mg/dL  ACETAMINOPHEN LEVEL     Status: None   Collection Time    04/04/13  2:00 PM      Result Value Range   Acetaminophen (Tylenol), Serum <15.0  10 - 30 ug/mL   Comment:            THERAPEUTIC CONCENTRATIONS VARY     SIGNIFICANTLY. A RANGE OF 10-30     ug/mL MAY BE AN EFFECTIVE     CONCENTRATION FOR MANY PATIENTS.     HOWEVER, SOME ARE BEST TREATED     AT CONCENTRATIONS OUTSIDE THIS     RANGE.     ACETAMINOPHEN CONCENTRATIONS     >150 ug/mL AT 4 HOURS AFTER     INGESTION AND >50 ug/mL AT 12     HOURS AFTER INGESTION ARE     OFTEN ASSOCIATED WITH TOXIC     REACTIONS.  URINE RAPID DRUG SCREEN (HOSP PERFORMED)     Status: None   Collection Time    04/04/13  5:19 PM      Result Value Range   Opiates NONE DETECTED  NONE DETECTED   Cocaine NONE DETECTED  NONE DETECTED   Benzodiazepines NONE DETECTED  NONE DETECTED   Amphetamines NONE DETECTED  NONE DETECTED   Tetrahydrocannabinol NONE DETECTED  NONE DETECTED   Barbiturates NONE DETECTED  NONE DETECTED   Comment:            DRUG SCREEN FOR MEDICAL PURPOSES     ONLY.  IF CONFIRMATION IS NEEDED     FOR ANY PURPOSE, NOTIFY LAB     WITHIN 5 DAYS.                LOWEST DETECTABLE LIMITS  FOR URINE DRUG SCREEN     Drug Class       Cutoff (ng/mL)     Amphetamine      1000     Barbiturate      200     Benzodiazepine   200     Tricyclics       300     Opiates          300     Cocaine          300     THC              50  URINALYSIS, ROUTINE W REFLEX MICROSCOPIC     Status: Abnormal   Collection Time    04/04/13  5:19 PM      Result Value Range   Color, Urine YELLOW  YELLOW   APPearance CLEAR  CLEAR   Specific Gravity, Urine 1.022  1.005 - 1.030   pH 7.5  5.0 - 8.0   Glucose, UA NEGATIVE  NEGATIVE mg/dL   Hgb urine dipstick NEGATIVE  NEGATIVE    Bilirubin Urine NEGATIVE  NEGATIVE   Ketones, ur 40 (*) NEGATIVE mg/dL   Protein, ur 30 (*) NEGATIVE mg/dL   Urobilinogen, UA 0.2  0.0 - 1.0 mg/dL   Nitrite NEGATIVE  NEGATIVE   Leukocytes, UA SMALL (*) NEGATIVE  URINE MICROSCOPIC-ADD ON     Status: None   Collection Time    04/04/13  5:19 PM      Result Value Range   WBC, UA 0-2  <3 WBC/hpf  GLUCOSE, CAPILLARY     Status: Abnormal   Collection Time    07-Apr-2013  3:26 AM      Result Value Range   Glucose-Capillary 101 (*) 70 - 99 mg/dL   Comment 1 Documented in Chart     Comment 2 Notify RN    POCT I-STAT TROPONIN I     Status: None   Collection Time    04/07/13  3:40 AM      Result Value Range   Troponin i, poc 0.01  0.00 - 0.08 ng/mL   Comment 3            Comment: Due to the release kinetics of cTnI,     a negative result within the first hours     of the onset of symptoms does not rule out     myocardial infarction with certainty.     If myocardial infarction is still suspected,     repeat the test at appropriate intervals.  CG4 I-STAT (LACTIC ACID)     Status: None   Collection Time    04/07/13  3:41 AM      Result Value Range   Lactic Acid, Venous 1.07  0.5 - 2.2 mmol/L  TSH     Status: None   Collection Time    2013/04/07 10:01 AM      Result Value Range   TSH 2.385  0.350 - 4.500 uIU/mL  TROPONIN I     Status: None   Collection Time    04-07-13 10:01 AM      Result Value Range   Troponin I <0.30  <0.30 ng/mL   Comment:            Due to the release kinetics of cTnI,     a negative result within the first hours     of the onset of symptoms does not rule out  myocardial infarction with certainty.     If myocardial infarction is still suspected,     repeat the test at appropriate intervals.  TROPONIN I     Status: None   Collection Time    05-02-13  2:27 PM      Result Value Range   Troponin I <0.30  <0.30 ng/mL   Comment:            Due to the release kinetics of cTnI,     a negative result within the  first hours     of the onset of symptoms does not rule out     myocardial infarction with certainty.     If myocardial infarction is still suspected,     repeat the test at appropriate intervals.    Ct Head Wo Contrast  05-02-13   *RADIOLOGY REPORT*  Clinical Data: Syncopal episode.  Suicidal.  CT HEAD WITHOUT CONTRAST  Technique:  Contiguous axial images were obtained from the base of the skull through the vertex without contrast.  Comparison: Brain MRI 10/30/2005.  Findings: Well defined region of low attenuation in the right cerebral hemisphere involving portions of the posterior right temporal and occipital regions, most compatible with gliosis at site of the prior surgical resection.  Mild patchy and confluent areas of decreased attenuation throughout the deep and periventricular white matter of the cerebral hemispheres bilaterally, compatible with mild chronic microvascular ischemic changes. No acute intracranial abnormalities.  Specifically, no evidence of acute intracranial hemorrhage, no definite findings of acute/subacute cerebral ischemia, no mass, mass effect, hydrocephalus or abnormal intra or extra-axial fluid collections. Visualized paranasal sinuses and mastoids are well pneumatized.  No acute displaced skull fractures are identified.  Postoperative changes of right-sided tempo-occipital craniotomy redemonstrated.  IMPRESSION: 1.  No acute intracranial abnormalities. 2.  Mild chronic microvascular ischemic changes in the cerebral white matter and postoperative changes, as above.   Original Report Authenticated By: Trudie Reed, M.D.   Dg Chest Portable 1 View  05-02-13   *RADIOLOGY REPORT*  Clinical Data: Compression.  Overdose.  PORTABLE CHEST - 1 VIEW  Comparison: Chest x-ray 08/25/2006.  Findings: Lung volumes are normal.  No consolidative airspace disease.  No pleural effusions.  No pneumothorax.  No pulmonary nodule or mass noted.  Pulmonary vasculature and the  cardiomediastinal silhouette are within normal limits.  IMPRESSION: 1. No radiographic evidence of acute cardiopulmonary disease.   Original Report Authenticated By: Trudie Reed, M.D.    Positive for anxiety, bad mood and depression Blood pressure 128/54, pulse 68, temperature 98.3 F (36.8 C), temperature source Oral, resp. rate 18, height 5\' 1"  (1.549 m), weight 43.999 kg (97 lb), SpO2 96.00%.   Assessment/Plan: Major depressive disorder, recurrent  Recommendation:  1. Patient does not meet criteria for acute psychiatric hospitalization as she has been contracting for safety and denies suicidal ideations intentions or plans at this time. She also regrets for her medication overdose 2. Discontinue Paxil due to significant adverse affect with overdose and may start Celexa or Prozac in near future 3. Referred to the outpatient psychiatric services and counseling  4. Appreciate psychiatric consultation and will sign off.  Tykera Skates,JANARDHAHA R. 05/02/2013, 3:56 PM

## 2013-04-05 NOTE — Progress Notes (Signed)
Clinical Social Work Department CLINICAL SOCIAL WORK PSYCHIATRY SERVICE LINE ASSESSMENT 04/26/2013  Patient:  Theresa Robinson  Account:  [id number]  Admit Date:  04/04/2013  Clinical Social Worker:  Unk Lightning, LCSW  Date/Time:  04/26/13 03:00 PM Referred by:  Physician  Date referred:  04-26-2013 Reason for Referral  Psychosocial assessment   Presenting Symptoms/Problems (In the person's/family's own words):   Psych consulted due to suicide attempt.   Abuse/Neglect/Trauma History (check all that apply)  Denies history   Abuse/Neglect/Trauma Comments:   Psychiatric History (check all that apply)  Outpatient treatment   Psychiatric medications:  Paxil 20 mg   Current Mental Health Hospitalizations/Previous Mental Health History:   Patient reports that PCP prescribed antidepressant several years ago.   Current provider:   Dr. Pete Glatter   Place and [place]   Current Medications:   acetaminophen, alum & mag hydroxide-simeth, HYDROcodone-acetaminophen, LORazepam, ondansetron (ZOFRAN) IV, ondansetron            . enoxaparin (LOVENOX) injection  30 mg Subcutaneous Q24H  . levothyroxine  50 mcg Oral QAC breakfast  . nicotine  21 mg Transdermal Daily  . PARoxetine  10 mg Oral Daily  . PARoxetine  20 mg Oral QHS  . sodium chloride  3 mL Intravenous Q12H   Previous Impatient Admission/Date/Reason:   None reported   Emotional Health / Current Symptoms    Suicide/Self Harm  Suicide attempt in past (date/description)   Suicide attempt in the past:   Patient admitted after taking several Paxil pills. Patient reports she cannot remember how many pills she consumed. Patient reports that she was feeling depressed about her loss of independence but now regrets her decision.   Other harmful behavior:   None reported   Psychotic/Dissociative Symptoms  None reported   Other Psychotic/Dissociative Symptoms:   Patient denies any psychotic features.     Attention/Behavioral Symptoms  Within Normal Limits   Other Attention / Behavioral Symptoms:   Patient had appropriate behavior throughout assessment.    Cognitive Impairment  Orientation - Place  Orientation - Self  Orientation - Situation  Orientation - Time  Other - See comment   Other Cognitive Impairment:   Patient reports that MD diagnosed her with dementia and encouraged her to stop driving.    Mood and Adjustment  Mood Congruent    Stress, Anxiety, Trauma, Any Recent Loss/Stressor  Other - See comment   Anxiety (frequency):   N/A   Phobia (specify):   N/A   Compulsive behavior (specify):   N/A   Obsessive behavior (specify):   N/A   Other:   Patient recently was forced to stop driving and to feels she has lost her independence.   Substance Abuse/Use  None   SBIRT completed (please refer for detailed history):  N  Self-reported substance use:   Patient denies all substance abuse.   Urinary Drug Screen Completed:  Y Alcohol level:   <11    Environmental/Housing/Living Arrangement  Stable housing   Who is in the home:   [facility]- independent living   Emergency contact:  L.B. Lipa-ex-husband   Financial  Medicare   Patient's Strengths and Goals (patient's own words):   Patient reports good informal support system.   Clinical Social Worker's Interpretive Summary:   CSW received referral to complete psychosocial assessment. CSW reviewed chart and met with patient at bedside. Patient alert and oriented and agreeable to assessment.    CSW introduced myself and explained role.  Patient reports she lives at [facility] and has lived there for about 12 years. Patient enjoys where she lives and speaks about the good friends she has made and the female friend who she is dating. Patient has been separated for several years but still has contact with her ex-husband. Patient has a 77 year old son who is mentally retarded and lives at  [place]. Patient was recently diagnosed with dementia and was informed to stop driving. Patient is depressed that she cannot visit her son as often and feels like a burden to ask others.    CSW and patient had lengthy discussion regarding her loss of independence and her informal supports. Patient and ex-husband used to visit son together and he transported patient about 3 weeks ago to visit son. Patient has a sister, friends and a pastor that has offered to transport patient. Patient understanding that friends are being supportive and agreeable to ask for assistance with transportation. CSW and patient used problem-sovling skills in order to establish other forms of transportation. Patient agreeable to use [facility]'s van system and patient agreeable to use taxis. CSW provided patient with [employer] application and assisted patient in completing form for interview. Patient agreeable to follow up with [facility] at DC.    Patient reports that she does not feel depressed and regrets her decision to overdose. Patient reports that she had made plans with her friend but once she did not show up, the facility came to check on her and called 911. Patient reports that this is her first suicide attempt. Patient has never received any formal treatment for depression besides PCP prescribing an antidepressant and going to a support group prior to separation.    Patient denies any current SI or HI. Patient is hopeful to DC home. CSW explained that psych MD would need to come and evaluate patient and that CSW would follow up to assist with recommendations provided. CSW will continue to follow.   Disposition:  Recommend Psych CSW continuing to support while in hospital

## 2013-04-05 NOTE — Evaluation (Signed)
Physical Therapy One Time Evaluation Patient Details Name: Theresa Robinson MRN: [id number] DOB: 01-15-1931 Today's Date: 04-19-13 Time: 4098-1191 PT Time Calculation (min): 10 min  PT Assessment / Plan / Recommendation Clinical Impression  Pt is an 79 year old female admitted for suicidal ideation and bradycardia likely due to overdose of Paxil.  Sitter reports pt very unsteady yesterday however likely due to meds.  Pt currently supervision level for mobility and pt reports being very close to baseline.  Pt doing well with mobility so encouraged to ambulate with staff during acute stay.  Pt reports no hx of falls or poor balance.  No PT needs identified at this time.    PT Assessment  Patent does not need any further PT services    Follow Up Recommendations  No PT follow up    Does the patient have the potential to tolerate intense rehabilitation      Barriers to Discharge        Equipment Recommendations  None recommended by PT    Recommendations for Other Services     Frequency      Precautions / Restrictions     Pertinent Vitals/Pain n/a      Mobility  Bed Mobility Bed Mobility: Supine to Sit;Sit to Supine Supine to Sit: 6: Modified independent (Device/Increase time) Sit to Supine: 6: Modified independent (Device/Increase time) Transfers Transfers: Sit to Stand;Stand to Sit Sit to Stand: 5: Supervision;From bed Stand to Sit: 5: Supervision;To bed Ambulation/Gait Ambulation/Gait Assistance: 5: Supervision Ambulation Distance (Feet): 400 Feet Assistive device: None Ambulation/Gait Assistance Details: slight unsteadiness occasionally but no true LOB or need for physical assist, encouraged pt to ambulate with staff during acute stay, pt states she feels very close to baseline (maybe a little slower than usual) Gait Pattern: Step-through pattern    Exercises     PT Diagnosis:    PT Problem List:   PT Treatment Interventions:     PT Goals    Visit  Information  Last PT Received On: April 19, 2013 Assistance Needed: +1    Subjective Data  Subjective: It's nice to look out the window.   Prior Functioning  Home Living Lives With: Spouse Available Help at Discharge: Skilled Nursing Facility Type of Home: Skilled Nursing Facility Home Adaptive Equipment: None Prior Function Level of Independence: Independent Comments: Pt reports being very independent however feels less so now due to license taken away due to her memory.  Ambulates to meals. Communication Communication: No difficulties    Cognition  Cognition Arousal/Alertness: Awake/alert Behavior During Therapy: WFL for tasks assessed/performed Overall Cognitive Status: Within Functional Limits for tasks assessed    Extremity/Trunk Assessment Right Upper Extremity Assessment RUE ROM/Strength/Tone: [facility] for tasks assessed Left Upper Extremity Assessment LUE ROM/Strength/Tone: WFL for tasks assessed Right Lower Extremity Assessment RLE ROM/Strength/Tone: [facility] for tasks assessed Left Lower Extremity Assessment LLE ROM/Strength/Tone: WFL for tasks assessed   Balance    End of Session PT - End of Session Activity Tolerance: Patient tolerated treatment well Patient left: in bed;with call bell/phone within reach;Other (comment) (with safety sitter)  GP     Maida Sale E 04/19/13, 2:41 PM Zenovia Jarred, PT, DPT 2013-04-19 Pager: [phone]

## 2013-04-05 NOTE — ED Notes (Signed)
Patient is resting comfortably. 

## 2013-04-05 NOTE — B[facility] Counselor (Addendum)
TC to Terri at [employer] on [facility]. She will fax pt's telepsych consult to [phone] [facility] office as original consult by Dr. Jacky Kindle was never received. Writer placed faxed consult in pt's chart and original given to EDP.   [phone] Per EDP Preston Fleeting, pt is being medically admitted.  [facility] no longer involved in pt's disposition.     Evette Cristal, [place] Assessment Counselor

## 2013-04-06 DIAGNOSIS — IMO0002 Reserved for concepts with insufficient information to code with codable children: Secondary | ICD-10-CM

## 2013-04-06 LAB — BASIC METABOLIC PANEL
BUN: 11 mg/dL (ref 6–23)
CO2: 26 mEq/L (ref 19–32)
Calcium: 9.1 mg/dL (ref 8.4–10.5)
Creatinine, Ser: 0.8 mg/dL (ref 0.50–1.10)
GFR calc non Af Amer: 68 mL/min — ABNORMAL LOW (ref >90)
Glucose, Bld: 97 mg/dL (ref 70–99)
Sodium: 139 mEq/L (ref 135–145)

## 2013-04-06 LAB — GLUCOSE, CAPILLARY: Glucose-Capillary: 124 mg/dL — ABNORMAL HIGH (ref 70–99)

## 2013-04-06 LAB — CBC
MCH: 28.2 pg (ref 26.0–34.0)
MCHC: 33.1 g/dL (ref 30.0–36.0)
MCV: 85.1 fL (ref 78.0–100.0)
Platelets: 163 10*3/uL (ref 150–400)
RBC: 4.44 MIL/uL (ref 3.87–5.11)
RDW: 13.9 % (ref 11.5–15.5)

## 2013-04-06 NOTE — Progress Notes (Signed)
Clinical Social Work Progress Note PSYCHIATRY SERVICE Robinson 04/06/2013  Patient:  Theresa Robinson  Account:  [id number]  Admit Date:  04/04/2013  Clinical Social Worker:  Unk Lightning, LCSW  Date/Time:  04/06/2013 11:00 AM  Review of Patient  Overall Medical Condition:   Patient reports she feels better and is ready to DC.   Participation Level:  Active  Participation Quality  Appropriate   Other Participation Quality:   Patient engaged throughout session.   Affect  Appropriate   Cognitive  Appropriate   Reaction to Medications/Concerns:   Patient reports she is going to take Prozac instead of Paxil.   Modes of Intervention  Problem-solving  Support   Summary of Progress/Plan at Discharge   CSW met with patient at bedside in order to discuss psych MD recommendations. Psych MD reports that patient can follow up on outpatient basis.    CSW spoke with patient regarding DC. Patient reports that facility can transport her to outpatient appointments or she can ask friends to transport her. CSW called [facility] who reports no waiting list and can accept Medicare. Patient signed release and CSW faxed information to facility. CSW provided patient with information to [facility] and placed on AVS. Agency reports that patient has to call and schedule appointment after DC from the hospital. CSW explained this information to patient who is agreeable to schedule appointment.    [facility] called CSW and asked for information regarding patient. CSW received permission from patient to discuss outpatient follow up with agency. CSW explained that patient would follow up with [facility]. Agency agreeable to assist with transportation if needed.    Patient contracting for safety and denies any SI or HI. Patient agreeable to outpatient follow up and feels she will well supported when returning to ind. living. CSW is signing off but available if further concerns arise.

## 2013-04-06 NOTE — Discharge Summary (Addendum)
Physician Discharge Summary  Theresa Robinson AOZ:[id number] DOB: 11/15/1930 DOA: 04/04/2013  PCP: Ginette Otto, MD  Admit date: 04/04/2013 Discharge date: 04/06/2013  Time spent: > 35 minutes  Recommendations for Outpatient Follow-up:  1. The patient will be discharged off of her Paxil.  Consideration will need to be given at starting another SSRI as indicated below per recommendations from the psychiatrist here in house.  Discharge Diagnoses:  Active Problems: Please see problem list below.  Discharge Condition: Stable  Diet recommendation: Low sodium heart healthy  Filed Weights   May 03, 2013 0917  Weight: 43.999 kg (97 lb)    History of present illness:  79 y/o with history of depression and hypothyroidism who presented to the hospital after ingesting 10-12 pills of paxil.  Hospital Course:  1) Major depressive disorder/drug overdose of paxil - Per psychiatry's recommendations: 1. Patient does not meet criteria for acute psychiatric hospitalization as she has been contracting for safety and denies suicidal ideations intentions or plans at this time. She also regrets for her medication overdose  2. Discontinue Paxil due to significant adverse affect with overdose and may start Celexa or Prozac in near future  3. Referred to the outpatient psychiatric services and counseling  4. Appreciate psychiatric consultation and will sign off.  Please refer to discharge instruction listed below as patient will require medication for her Major depressive disorder in a few days as indicated above by psychiatrist.  Addendum: 2) hypothyroidism - will continue home regimen.  Procedures:  none  Consultations: Psychiatry: Nehemiah Settle.    Discharge Exam: Filed Vitals:   05/03/2013 1600 05/03/2013 2200 05-03-13 2258 04/06/13 0551  BP: 134/59  149/70 100/66  Pulse: 65 71  72  Temp: 98.6 F (37 C) 98.4 F (36.9 C)  98.2 F (36.8 C)  TempSrc: Oral Oral  Oral  Resp:  18 18  16   Height:      Weight:      SpO2: 99% 97%  100%    General: Pt in NAD, Alert and Awake Cardiovascular: RRR, no MRG Respiratory: CTA BL, no wheezes  Discharge Instructions  Discharge Orders   Future Orders Complete By Expires     Call MD for:  redness, tenderness, or signs of infection (pain, swelling, redness, odor or green/yellow discharge around incision site)  As directed     Call MD for:  temperature >100.4  As directed     Diet - low sodium heart healthy  As directed     Discharge instructions  As directed     Comments:      Follow up with your primary care physician in 2-3 days after discharge.  At that point they can decide whether or not to start celexa or prozac as indicated by the psychiatrist.    Increase activity slowly  As directed         Medication List    STOP taking these medications       PARoxetine 10 MG tablet  Commonly known as:  PAXIL      TAKE these medications       levothyroxine 50 MCG tablet  Commonly known as:  SYNTHROID, LEVOTHROID  Take 50 mcg by mouth daily before breakfast.       No Known Allergies     Follow-up Information   Follow up with [facility]. Call in 1 day. (Call and schedule an appointment with psychiatrist)    Contact information:   [address], [place] [address] [phone]  The results of significant diagnostics from this hospitalization (including imaging, microbiology, ancillary and laboratory) are listed below for reference.    Significant Diagnostic Studies: Ct Head Wo Contrast  Apr 23, 2013   *RADIOLOGY REPORT*  Clinical Data: Syncopal episode.  Suicidal.  CT HEAD WITHOUT CONTRAST  Technique:  Contiguous axial images were obtained from the base of the skull through the vertex without contrast.  Comparison: Brain MRI 10/30/2005.  Findings: Well defined region of low attenuation in the right cerebral hemisphere involving portions of the posterior right temporal and occipital regions,  most compatible with gliosis at site of the prior surgical resection.  Mild patchy and confluent areas of decreased attenuation throughout the deep and periventricular white matter of the cerebral hemispheres bilaterally, compatible with mild chronic microvascular ischemic changes. No acute intracranial abnormalities.  Specifically, no evidence of acute intracranial hemorrhage, no definite findings of acute/subacute cerebral ischemia, no mass, mass effect, hydrocephalus or abnormal intra or extra-axial fluid collections. Visualized paranasal sinuses and mastoids are well pneumatized.  No acute displaced skull fractures are identified.  Postoperative changes of right-sided tempo-occipital craniotomy redemonstrated.  IMPRESSION: 1.  No acute intracranial abnormalities. 2.  Mild chronic microvascular ischemic changes in the cerebral white matter and postoperative changes, as above.   Original Report Authenticated By: Trudie Reed, M.D.   Dg Chest Portable 1 View  2013/04/23   *RADIOLOGY REPORT*  Clinical Data: Compression.  Overdose.  PORTABLE CHEST - 1 VIEW  Comparison: Chest x-ray 08/25/2006.  Findings: Lung volumes are normal.  No consolidative airspace disease.  No pleural effusions.  No pneumothorax.  No pulmonary nodule or mass noted.  Pulmonary vasculature and the cardiomediastinal silhouette are within normal limits.  IMPRESSION: 1. No radiographic evidence of acute cardiopulmonary disease.   Original Report Authenticated By: Trudie Reed, M.D.    Microbiology: No results found for this or any previous visit (from the past 240 hour(s)).   Labs: Basic Metabolic Panel:  Recent Labs Lab 04/04/13 1400 04/06/13 0518  NA 137 139  K 3.9 3.9  CL 101 106  CO2 24 26  GLUCOSE 105* 97  BUN 16 11  CREATININE 0.76 0.80  CALCIUM 10.1 9.1   Liver Function Tests:  Recent Labs Lab 04/04/13 1400  AST 32  ALT 23  ALKPHOS 99  BILITOT 0.4  PROT 7.3  ALBUMIN 4.1   No results found for this  basename: LIPASE, AMYLASE,  in the last 168 hours No results found for this basename: AMMONIA,  in the last 168 hours CBC:  Recent Labs Lab 04/04/13 1400 04/06/13 0518  WBC 7.5 5.9  NEUTROABS 6.9  --   HGB 14.0 12.5  HCT 39.8 37.8  MCV 83.1 85.1  PLT 191 163   Cardiac Enzymes:  Recent Labs Lab 04/23/13 1001 April 23, 2013 1427 April 23, 2013 2036  TROPONINI <0.30 <0.30 <0.30   BNP: BNP (last 3 results) No results found for this basename: PROBNP,  in the last 8760 hours CBG:  Recent Labs Lab 04-23-2013 0326 04/06/13 0739  GLUCAP 101* 124*       Signed:  Penny Pia  [facility] 04/06/2013, 1:43 PM

## 2013-04-06 NOTE — Progress Notes (Signed)
Visited with patient today. She seems happy that she is being discharged today. She relates that the staff has been very helpful to her and also indicates that she has many good friends at [facility], where she resides. She reports that she regrets not being able to drive anymore due to her dementia, but we talked about several alternative forms of transportation she will be able to use, such as [employer] transportation, and taxis. I offered support and offered a prayer for peace and calm as she returns home.

## 2013-04-14 ENCOUNTER — Observation Stay (HOSPITAL_COMMUNITY)
Admission: EM | Admit: 2013-04-14 | Discharge: 2013-04-16 | Disposition: A | Discharge status: Skilled Nursing Facility | Payer: Medicare Other | Attending: Internal Medicine | Admitting: Internal Medicine

## 2013-04-14 ENCOUNTER — Encounter (HOSPITAL_COMMUNITY): Payer: Self-pay | Admitting: Emergency Medicine

## 2013-04-14 DIAGNOSIS — I951 Orthostatic hypotension: Secondary | ICD-10-CM | POA: Diagnosis present

## 2013-04-14 DIAGNOSIS — R262 Difficulty in walking, not elsewhere classified: Secondary | ICD-10-CM | POA: Insufficient documentation

## 2013-04-14 DIAGNOSIS — R55 Syncope and collapse: Principal | ICD-10-CM | POA: Diagnosis present

## 2013-04-14 DIAGNOSIS — R9431 Abnormal electrocardiogram [ECG] [EKG]: Secondary | ICD-10-CM | POA: Insufficient documentation

## 2013-04-14 DIAGNOSIS — E860 Dehydration: Secondary | ICD-10-CM | POA: Diagnosis present

## 2013-04-14 DIAGNOSIS — F0390 Unspecified dementia without behavioral disturbance: Secondary | ICD-10-CM | POA: Insufficient documentation

## 2013-04-14 DIAGNOSIS — R279 Unspecified lack of coordination: Secondary | ICD-10-CM | POA: Insufficient documentation

## 2013-04-14 DIAGNOSIS — R61 Generalized hyperhidrosis: Secondary | ICD-10-CM | POA: Insufficient documentation

## 2013-04-14 DIAGNOSIS — E039 Hypothyroidism, unspecified: Secondary | ICD-10-CM | POA: Diagnosis present

## 2013-04-14 DIAGNOSIS — E079 Disorder of thyroid, unspecified: Secondary | ICD-10-CM | POA: Insufficient documentation

## 2013-04-14 HISTORY — DX: Unspecified dementia, unspecified severity, without behavioral disturbance, psychotic disturbance, mood disturbance, and anxiety: F03.90

## 2013-04-14 LAB — URINALYSIS, ROUTINE W REFLEX MICROSCOPIC
Glucose, UA: NEGATIVE mg/dL
Ketones, ur: >80 mg/dL — AB
Nitrite: NEGATIVE
Protein, ur: 30 mg/dL — AB
pH: 6 (ref 5.0–8.0)

## 2013-04-14 LAB — TROPONIN I
Troponin I: <0.3 ng/mL (ref <0.30)
Troponin I: <0.3 ng/mL (ref <0.30)

## 2013-04-14 LAB — BASIC METABOLIC PANEL
CO2: 23 mEq/L (ref 19–32)
Calcium: 8.3 mg/dL — ABNORMAL LOW (ref 8.4–10.5)
GFR calc non Af Amer: 57 mL/min — ABNORMAL LOW (ref >90)
Glucose, Bld: 78 mg/dL (ref 70–99)
Potassium: 3.8 mEq/L (ref 3.5–5.1)
Sodium: 135 mEq/L (ref 135–145)

## 2013-04-14 LAB — CBC WITH DIFFERENTIAL/PLATELET
Basophils Absolute: 0 10*3/uL (ref 0.0–0.1)
Eosinophils Relative: 0 % (ref 0–5)
Lymphocytes Relative: 10 % — ABNORMAL LOW (ref 12–46)
Lymphs Abs: 0.7 10*3/uL (ref 0.7–4.0)
Neutrophils Relative %: 83 % — ABNORMAL HIGH (ref 43–77)
Platelets: 181 10*3/uL (ref 150–400)
RBC: 4.61 MIL/uL (ref 3.87–5.11)
RDW: 13.5 % (ref 11.5–15.5)
WBC: 6.8 10*3/uL (ref 4.0–10.5)

## 2013-04-14 LAB — GLUCOSE, CAPILLARY: Glucose-Capillary: 74 mg/dL (ref 70–99)

## 2013-04-14 MED ORDER — SODIUM CHLORIDE 0.9 % IV SOLN
INTRAVENOUS | Status: DC
  Administered 2013-04-14: 16:00:00 via INTRAVENOUS

## 2013-04-14 MED ORDER — SODIUM CHLORIDE 0.9 % IV SOLN
INTRAVENOUS | Status: DC
  Administered 2013-04-14 – 2013-04-15 (×4): via INTRAVENOUS

## 2013-04-14 MED ORDER — ENOXAPARIN SODIUM 30 MG/0.3ML ~~LOC~~ SOLN
30.0000 mg | Freq: Every day | SUBCUTANEOUS | Status: DC
  Administered 2013-04-15 – 2013-04-16 (×2): 30 mg via SUBCUTANEOUS
  Filled 2013-04-14 (×2): qty 0.3

## 2013-04-14 MED ORDER — SODIUM CHLORIDE 0.9 % IV BOLUS (SEPSIS)
1000.0000 mL | Freq: Once | INTRAVENOUS | Status: AC
  Administered 2013-04-14: 1000 mL via INTRAVENOUS

## 2013-04-14 MED ORDER — ONDANSETRON HCL 4 MG/2ML IJ SOLN: 4.0000 mg | Freq: Four times a day (QID) | INTRAMUSCULAR | Status: DC | PRN

## 2013-04-14 MED ORDER — SENNOSIDES-DOCUSATE SODIUM 8.6-50 MG PO TABS
1.0000 | ORAL_TABLET | Freq: Every evening | ORAL | Status: DC | PRN
  Filled 2013-04-14: qty 1

## 2013-04-14 MED ORDER — LEVOTHYROXINE SODIUM 50 MCG PO TABS
50.0000 ug | ORAL_TABLET | Freq: Every day | ORAL | Status: DC
  Administered 2013-04-15 – 2013-04-16 (×2): 50 ug via ORAL
  Filled 2013-04-14 (×3): qty 1

## 2013-04-14 MED ORDER — ONDANSETRON HCL 4 MG PO TABS: 4.0000 mg | ORAL_TABLET | Freq: Four times a day (QID) | ORAL | Status: DC | PRN

## 2013-04-14 MED ORDER — ASPIRIN EC 81 MG PO TBEC
81.0000 mg | DELAYED_RELEASE_TABLET | Freq: Every day | ORAL | Status: DC
  Administered 2013-04-14 – 2013-04-16 (×3): 81 mg via ORAL
  Filled 2013-04-14 (×3): qty 1

## 2013-04-14 MED ORDER — CITALOPRAM HYDROBROMIDE 10 MG PO TABS
10.0000 mg | ORAL_TABLET | Freq: Every day | ORAL | Status: DC
  Administered 2013-04-15 – 2013-04-16 (×2): 10 mg via ORAL
  Filled 2013-04-14 (×2): qty 1

## 2013-04-14 MED ORDER — SODIUM CHLORIDE 0.9 % IJ SOLN
3.0000 mL | Freq: Two times a day (BID) | INTRAMUSCULAR | Status: DC
  Administered 2013-04-14 – 2013-04-16 (×4): 3 mL via INTRAVENOUS

## 2013-04-14 MED ORDER — ENOXAPARIN SODIUM 40 MG/0.4ML ~~LOC~~ SOLN
40.0000 mg | SUBCUTANEOUS | Status: DC
  Administered 2013-04-14: 40 mg via SUBCUTANEOUS
  Filled 2013-04-14: qty 0.4

## 2013-04-14 MED ORDER — DONEPEZIL HCL 5 MG PO TABS
5.0000 mg | ORAL_TABLET | Freq: Every evening | ORAL | Status: DC | PRN
  Filled 2013-04-14: qty 1

## 2013-04-14 MED ORDER — PANTOPRAZOLE SODIUM 40 MG PO TBEC
40.0000 mg | DELAYED_RELEASE_TABLET | Freq: Every day | ORAL | Status: DC
  Administered 2013-04-14 – 2013-04-16 (×3): 40 mg via ORAL
  Filled 2013-04-14 (×3): qty 1

## 2013-04-14 MED ORDER — PNEUMOCOCCAL VAC POLYVALENT 25 MCG/0.5ML IJ INJ: 0.5000 mL | INJECTION | INTRAMUSCULAR | Status: DC

## 2013-04-14 NOTE — ED Notes (Signed)
Pt from [facility]. Pt had syncopal episode witnessed by nurse. Pt new onset dementia. Pt diaphoretic, cold, pale upon EMS arrival. Nurse was able to ease pt to floor without injury.

## 2013-04-14 NOTE — ED Provider Notes (Signed)
History    CSN: [id number] Arrival date & time 04/14/13  1047  First MD Initiated Contact with Patient 04/14/13 1051     Chief Complaint  Patient presents with  . Loss of Consciousness   (Consider location/radiation/quality/duration/timing/severity/associated sxs/prior Treatment) HPI 79 year old female presents to emergency apartment from her nursing facility with reported witnessed syncope.  Patient denies any presyncopal symptoms such as chest pain, shortness of breath, palpitations, or headache.  She reports since recovering from the syncopal event, she has been shaky and cold.  Patient did not strike her head when she syncopized, was assisted down to the floor by nearby nurse.  Patient recently admitted 10 days ago to [facility] after overdose of Paxil.  She was witnessed to have bradycardic episode there in the psych ED, and had syncope.  She was admitted, and observed for paxil overdose per chart.  Pt reports she has "been managing" since d/c back to facility.  Reports normal eating, appetite.  No fevers, chills, diarrhea, or urinary symptoms.  Past Medical History  Diagnosis Date  . Thyroid disease   . Dementia    Past Surgical History  Procedure Laterality Date  . Lt catarract     No family history on file. History  Substance Use Topics  . Smoking status: Never Smoker   . Smokeless tobacco: Not on file  . Alcohol Use: No   OB History   Grav Para Term Preterm Abortions TAB SAB Ect Mult Living                 Review of Systems  All other systems reviewed and are negative.    Allergies  Review of patient's allergies indicates no known allergies.  Home Medications   Current Outpatient Rx  Name  Route  Sig  Dispense  Refill  . donepezil (ARICEPT) 5 MG tablet   Oral   Take 5 mg by mouth at bedtime as needed and may repeat dose one time if needed (dementia).         Marland Kitchen levothyroxine (SYNTHROID, LEVOTHROID) 50 MCG tablet   Oral   Take 50 mcg by mouth  daily before breakfast.          BP 134/62  Pulse 72  Temp(Src) 97.7 F (36.5 C)  Resp 16  SpO2 97% Physical Exam  Nursing note and vitals reviewed. Constitutional: She is oriented to person, place, and time. She appears well-developed and well-nourished.  HENT:  Head: Normocephalic and atraumatic.  Nose: Nose normal.  Mouth/Throat: Oropharynx is clear and moist.  Eyes: Conjunctivae and EOM are normal. Pupils are equal, round, and reactive to light.  Neck: Normal range of motion. Neck supple. No JVD present. No tracheal deviation present. No thyromegaly present.  Cardiovascular: Normal rate, regular rhythm, normal heart sounds and intact distal pulses.  Exam reveals no gallop and no friction rub.   No murmur heard. Pulmonary/Chest: Effort normal and breath sounds normal. No stridor. No respiratory distress. She has no wheezes. She has no rales. She exhibits no tenderness.  Abdominal: Soft. Bowel sounds are normal. She exhibits no distension and no mass. There is no tenderness. There is no rebound and no guarding.  Musculoskeletal: Normal range of motion. She exhibits no edema and no tenderness.  Lymphadenopathy:    She has no cervical adenopathy.  Neurological: She is alert and oriented to person, place, and time. She has normal reflexes. No cranial nerve deficit. She exhibits normal muscle tone. Coordination normal.  Skin: Skin is  warm and dry. No rash noted. No erythema. No pallor.  Psychiatric: She has a normal mood and affect. Her behavior is normal. Judgment and thought content normal.    ED Course  Procedures (including critical care time) Labs Reviewed  CBC WITH DIFFERENTIAL - Abnormal; Notable for the following:    Neutrophils Relative % 83 (*)    Lymphocytes Relative 10 (*)    All other components within normal limits  BASIC METABOLIC PANEL - Abnormal; Notable for the following:    Calcium 8.3 (*)    GFR calc non Af Amer 57 (*)    GFR calc Af Amer 66 (*)    All  other components within normal limits  URINALYSIS, ROUTINE W REFLEX MICROSCOPIC - Abnormal; Notable for the following:    APPearance HAZY (*)    Bilirubin Urine MODERATE (*)    Ketones, ur >80 (*)    Protein, ur 30 (*)    Leukocytes, UA TRACE (*)    All other components within normal limits  URINE MICROSCOPIC-ADD ON - Abnormal; Notable for the following:    Squamous Epithelial / LPF FEW (*)    Casts HYALINE CASTS (*)    All other components within normal limits  TROPONIN I  GLUCOSE, CAPILLARY    Date: 04/14/2013  Rate: 58  Rhythm: sinus bradycardia  QRS Axis: normal  Intervals: normal  ST/T Wave abnormalities: nonspecific T wave changes  Conduction Disutrbances:none  Narrative Interpretation:   Old EKG Reviewed: unchanged    No results found. 1. Syncope   2. Dehydration     MDM  51 female with syncope, recent history of same, but seems to have been attributed to paxil overdose.  Pt denies overdose or misuse of medications.  Will get labs, d/w hospitalist for admission.  1:11 PM Pt with ketones in urine.  HR increases with standing, possible dehydration cause for syncope.  No n/v/d.  Will give ns bolus, consult hospitalist.   Olivia Mackie, MD 04/14/13 1311

## 2013-04-14 NOTE — H&P (Signed)
[facility] History and Physical  Theresa Robinson ZOX:[id number] DOB: 1931/03/17 DOA: 04/14/2013  Referring physician:  PCP: Ginette Otto, MD  Specialists:  Chief Complaint: Syncope  HPI: Theresa Robinson is a 79 y.o. female with history hypothyroidism, last admitted 6/16 for suicide attempt with Paxil and was bradycardic at that time, who presents following a syncopal episode. She states that when she woke up this morning she was in the process of packing up to [facility], but that  she 'just didn't feel too good' - felt like her heart was racing , and the next thing she knew she was waking up on the floor. Per EDP patient had a witnessed syncopal episode at [facility]. Patient denies chest pain, dizziness, shortness of breath, focal weakness, nausea or vomiting and no diarrhea. She states that for a long while she's had decreased by mouth intake-just poor appetite. She was brought to the ED and was found to be orthostatic by pulse and felt dizzy when she stood up per EDP, troponin was negative, EKG showed sinus rhythm rate of 58. She is admitted for further evaluation and management.   Review of Systems: The patient denies anorexia, fever, weight loss,, vision loss, decreased hearing, hoarseness, chest pain, syncope, dyspnea on exertion, peripheral edema, balance deficits, hemoptysis, abdominal pain, melena, hematochezia, severe indigestion/heartburn, hematuria, incontinence, genital sores, muscle weakness, suspicious skin lesions, transient blindness, difficulty walking.  Past Medical History  Diagnosis Date  . Thyroid disease   . Dementia    Past Surgical History  Procedure Laterality Date  . Lt catarract     Social History:  reports that she has never smoked. She does not have any smokeless tobacco history on file. She reports that she does not drink alcohol or use illicit drugs.  where does patient live-- [facility]   No Known  Allergies  Family History: Pt denies family history of CAD, also no family h/o stroke  Prior to Admission medications   Medication Sig Start Date End Date Taking? Authorizing Provider  donepezil (ARICEPT) 5 MG tablet Take 5 mg by mouth at bedtime as needed and may repeat dose one time if needed (dementia).   Yes Historical Provider, MD  levothyroxine (SYNTHROID, LEVOTHROID) 50 MCG tablet Take 50 mcg by mouth daily before breakfast.   Yes Historical Provider, MD   Physical Exam: Filed Vitals:   04/14/13 1135 04/14/13 1136 04/14/13 1137 04/14/13 1429  BP: 126/54 139/56 134/62 123/51  Pulse: 54 69 72 67  Temp:    98 F (36.7 C)  TempSrc:    Oral  Resp:    16  Height:    5\' 1"  (1.549 m)  Weight:    43.545 kg (96 lb)  SpO2:    99%   Constitutional: Vital signs reviewed.  Patient is a well-developed and well-nourished  in no acute distress and cooperative with exam. Alert and oriented x3.  Head: Normocephalic and atraumatic Nose: No erythema or drainage noted.   Mouth: no erythema or exudates, slightly dry MM Eyes: PERRL, EOMI, conjunctivae normal, No scleral icterus.  Neck: Supple, Trachea midline normal ROM, No JVD, mass, thyromegaly, or carotid bruit present.  Cardiovascular: RRR, S1 normal, S2 normal, no MRG, pulses symmetric and intact bilaterally Pulmonary/Chest: normal respiratory effort, CTAB, no wheezes, rales, or rhonchi Abdominal: Soft. Non-tender, non-distended, bowel sounds are normal, no masses, organomegaly, or guarding present.  GU: no CVA tenderness Extremities: No cyanosis and no edema  Hematology:  no cervical, inginal, or axillary adenopathy.  Neurological: A&O x3, Strength is normal and symmetric bilaterally, cranial nerve II-XII are grossly intact, no focal motor deficit, sensory intact to light touch bilaterally.  Skin: Warm, dry and intact. No rash.  Psychiatric: Normal mood and affect. speech and behavior is normal.     Labs on Admission:  Basic Metabolic  Panel:  Recent Labs Lab 04/14/13 1149  NA 135  K 3.8  CL 102  CO2 23  GLUCOSE 78  BUN 16  CREATININE 0.92  CALCIUM 8.3*   Liver Function Tests: No results found for this basename: AST, ALT, ALKPHOS, BILITOT, PROT, ALBUMIN,  in the last 168 hours No results found for this basename: LIPASE, AMYLASE,  in the last 168 hours No results found for this basename: AMMONIA,  in the last 168 hours CBC:  Recent Labs Lab 04/14/13 1149  WBC 6.8  NEUTROABS 5.6  HGB 13.4  HCT 39.2  MCV 85.0  PLT 181   Cardiac Enzymes:  Recent Labs Lab 04/14/13 1149  TROPONINI <0.30    BNP (last 3 results) No results found for this basename: PROBNP,  in the last 8760 hours CBG:  Recent Labs Lab 04/14/13 1205  GLUCAP 74    Radiological Exams on Admission: No results found.    Assessment/Plan Active Problems:   Syncope -As discussed above, likely secondary to orthostasis -Will hydrate, follow and recheck orthostatics in a.m. -Also cycle cardiac enzymes, obtain carotid Dopplers, 2-D echo and follow   Dehydration /Orthostasis -As discussed above, hydrate follow and recheck   Unspecified hypothyroidism -Continue Synthroid History of depression -As above she was recently admitted following a suicide attempt and discharged off Paxil, and per psych consult of 6/16 starting Celexa or Prozac in the near future was recommended-given the patient has decreased appetite which may be a symptom of her depression we'll go ahead and start Celexa at this time and followed.     Code Status: full Family Communication: none at bedside Disposition Plan: admit to TELE  Time spent: >30  Kela Millin [facility] Pager [phone]  If 7PM-7AM, please contact night-coverage [email or web address] Password [facility] 04/14/2013, 4:07 PM

## 2013-04-15 DIAGNOSIS — R55 Syncope and collapse: Secondary | ICD-10-CM

## 2013-04-15 DIAGNOSIS — I369 Nonrheumatic tricuspid valve disorder, unspecified: Secondary | ICD-10-CM

## 2013-04-15 LAB — BASIC METABOLIC PANEL
GFR calc Af Amer: 71 mL/min — ABNORMAL LOW (ref >90)
GFR calc non Af Amer: 61 mL/min — ABNORMAL LOW (ref >90)
Glucose, Bld: 77 mg/dL (ref 70–99)
Potassium: 4 mEq/L (ref 3.5–5.1)
Sodium: 140 mEq/L (ref 135–145)

## 2013-04-15 LAB — CBC
Hemoglobin: 12.2 g/dL (ref 12.0–15.0)
MCHC: 33.5 g/dL (ref 30.0–36.0)
Platelets: 166 10*3/uL (ref 150–400)

## 2013-04-15 LAB — TROPONIN I: Troponin I: <0.3 ng/mL (ref <0.30)

## 2013-04-15 NOTE — Progress Notes (Addendum)
Nutrition Brief Note  RD pulled to patient due to body mass index is 17.7 kg/m2 (Underweight).    Current diet order is Regular.  Patient reports a good appetite and is consuming approximately 75% of meals at this time.  Labs and medications reviewed.   No nutrition interventions warranted at this time.  If nutrition issues arise, please consult RD.   Maureen Chatters, RD, LDN Pager #: [phone] After-Hours Pager #: [phone]

## 2013-04-15 NOTE — Progress Notes (Signed)
[name], RN from [facility] called and wanted to let us know that the patient when she fell actually was shaking similar to a seizure, She also wanted to let us know that the pt has had a crainiotomy in the past and when she took paxil it had been for years.

## 2013-04-15 NOTE — Progress Notes (Signed)
Utilization review completed.  

## 2013-04-15 NOTE — Evaluation (Signed)
Physical Therapy Evaluation Patient Details Name: Theresa Robinson MRN: [id number] DOB: [birth year] Today's Date: 04/15/2013 Time: 7829-5621 PT Time Calculation (min): 18 min  PT Assessment / Plan / Recommendation History of Present Illness  Pt admitted for syncopal episode with recent history of suicide attempt from [facility] and moving to assisted living  Clinical Impression  Pt demonstrates balance deficits as noted by Sharlene Motts but currently at her baseline and will have assist at ALF. REcommend OPPT to continue therapy to improve balance and decrease fall risk. No dizziness or LOB with activity and pt educated for balance deficits and fall risk and encouraged to have assist for single limb stance items such as bathing and to sit with dressing. *    PT Assessment  All further PT needs can be met in the next venue of care    Follow Up Recommendations  Outpatient PT    Does the patient have the potential to tolerate intense rehabilitation      Barriers to Discharge        Equipment Recommendations  None recommended by PT    Recommendations for Other Services     Frequency      Precautions / Restrictions Precautions Precautions: Fall   Pertinent Vitals/Pain No pain HR 77      Mobility  Bed Mobility Bed Mobility: Supine to Sit Supine to Sit: 6: Modified independent (Device/Increase time) Transfers Transfers: Stand Pivot Transfers Sit to Stand: 6: Modified independent (Device/Increase time);From bed;From chair/3-in-1 Stand to Sit: 6: Modified independent (Device/Increase time);To chair/3-in-1;To bed Stand Pivot Transfers: 6: Modified independent (Device/Increase time) Ambulation/Gait Ambulation/Gait Assistance: 5: Supervision Ambulation Distance (Feet): 400 Feet Assistive device: None Ambulation/Gait Assistance Details: slight unsteadiness with decreasd speed with head turns but no LOB, cues to return to correct room although able to provide correct  room number. Education for safety with gait with challenges Gait Pattern: Step-through pattern Gait velocity: decreased Stairs: No    Exercises     PT Diagnosis: Difficulty walking  PT Problem List: Decreased balance PT Treatment Interventions:       PT Goals(Current goals can be found in the care plan section)    Visit Information  Last PT Received On: 04/15/13 Assistance Needed: +1 History of Present Illness: Pt admitted for syncopal episode with recent history of suicide attempt from [facility] and moving to assisted living       Prior Functioning  Home Living Family/patient expects to be discharged to:: Assisted living Living Arrangements: Alone Type of Home: Assisted living Home Equipment: None  Lives With: Alone Prior Function Level of Independence: Independent Comments: was cooking and going to dining hall for one meal a day Communication Communication: No difficulties    Cognition  Cognition Arousal/Alertness: Awake/alert Overall Cognitive Status: History of cognitive impairments - at baseline Memory: Decreased short-term memory    Extremity/Trunk Assessment Upper Extremity Assessment Upper Extremity Assessment: Overall WFL for tasks assessed Lower Extremity Assessment Lower Extremity Assessment: Overall WFL for tasks assessed Cervical / Trunk Assessment Cervical / Trunk Assessment: Normal   Balance Standardized Balance Assessment Standardized Balance Assessment: Berg Balance Test Berg Balance Test Sit to Stand: Able to stand without using hands and stabilize independently Standing Unsupported: Able to stand 2 minutes with supervision Sitting with Back Unsupported but Feet Supported on Floor or Stool: Able to sit safely and securely 2 minutes Stand to Sit: Sits safely with minimal use of hands Transfers: Able to transfer safely, minor use of hands Standing Unsupported  with Eyes Closed: Able to stand 10 seconds safely Standing  Ubsupported with Feet Together: Able to place feet together independently and stand 1 minute safely From Standing, Reach Forward with Outstretched Arm: Can reach forward >12 cm safely (5") From Standing Position, Pick up Object from Floor: Able to pick up shoe safely and easily From Standing Position, Turn to Look Behind Over each Shoulder: Looks behind from both sides and weight shifts well Turn 360 Degrees: Able to turn 360 degrees safely but slowly Standing Unsupported, Alternately Place Feet on Step/Stool: Needs assistance to keep from falling or unable to try Standing Unsupported, One Foot in Front: Able to take small step independently and hold 30 seconds Standing on One Leg: Tries to lift leg/unable to hold 3 seconds but remains standing independently Total Score: 43  End of Session PT - End of Session Equipment Utilized During Treatment: Gait belt Activity Tolerance: Patient tolerated treatment well Patient left: in chair;with call bell/phone within reach;with chair alarm set Nurse Communication: Mobility status  GP     Toney Sang Beth 04/15/2013, 11:43 AM Delaney Meigs, PT [phone]

## 2013-04-15 NOTE — Progress Notes (Signed)
Bilateral carotid artery duplex:  Less than 39% ICA stenosis.  Vertebral artery flow is antegrade.     

## 2013-04-15 NOTE — Progress Notes (Signed)
Clinical Social Work Department BRIEF PSYCHOSOCIAL ASSESSMENT 04/15/2013  Patient:  Theresa Robinson, Theresa Robinson     Account Number:  [id number]     Admit date:  04/14/2013  Clinical Social Worker:  Orpah Greek  Date/Time:  04/15/2013 01:07 PM  Referred by:  Physician  Date Referred:  04/15/2013 Referred for  ALF Placement   Other Referral:   Interview type:  Patient Other interview type:    PSYCHOSOCIAL DATA Living Status:  FACILITY Admitted from facility:  [facility] Level of care:  Independent Living Primary support name:  LB Malek (relative) ph#: [phone] Primary support relationship to patient:   Degree of support available:    CURRENT CONCERNS Current Concerns  Post-Acute Placement   Other Concerns:    SOCIAL WORK ASSESSMENT / PLAN CSW spoke with patient re: discharge planning. Patient was admitted from [facility] but was in the middle of moving to Assisted Living.   Assessment/plan status:  Information/Referral to [facility] Other assessment/ plan:   Information/referral to community resources:   CSW completed FL2 and faxed information to [facility] - confirmed with [facility] that they will have a bed available for patient at ALF when ready for discharge.    PATIENT'S/FAMILY'S RESPONSE TO PLAN OF CARE: Patient is looking forward to moving to ALF @ [facility] from Independent Living.       Unice Bailey, LCSW Clinical Social Worker cell #: [phone]

## 2013-04-15 NOTE — Progress Notes (Signed)
[facility] PROGRESS NOTE  Theresa Robinson ZOX:[id number] DOB: 1931/02/03 DOA: 04/14/2013 PCP: Ginette Otto, MD  Assessment/Plan: Active Problems:   Syncope   Dehydration   Unspecified hypothyroidism   Orthostasis   Syncope Cardiac enzymes negative EKG shows normal sinus rhythm/sinus bradycardia 2-D echo pending Carotid Doppler pending  Dehydration Has been receiving IV fluids  Dementia continue Aricept    Code Status: full Family Communication: family updated about patient's clinical progress Disposition Plan:  Likely discharge tomorrow  Brief narrative: Theresa Robinson is a 79 y.o. female with history hypothyroidism, last admitted 6/16 for suicide attempt with Paxil and was bradycardic at that time, who presents following a syncopal episode. She states that when she woke up this morning she was in the process of packing up to [facility], but that she 'just didn't feel too good' - felt like her heart was racing , and the next thing she knew she was waking up on the floor. Per EDP patient had a witnessed syncopal episode at [facility]. Patient denies chest pain, dizziness, shortness of breath, focal weakness, nausea or vomiting and no diarrhea. She states that for a long while she's had decreased by mouth intake-just poor appetite. She was brought to the ED and was found to be orthostatic by pulse and felt dizzy when she stood up per EDP, troponin was negative, EKG showed sinus rhythm rate of 58. She is admitted for further evaluation and management.   Consultants:  None  Procedures:  *None  Antibiotics:  None  HPI/Subjective: Stable overnight  Objective: Filed Vitals:   04/15/13 0553 04/15/13 0557 04/15/13 0838 04/15/13 1136  BP: 112/74 113/72 108/69   Pulse: 70 72 80 77  Temp:   98 F (36.7 C)   TempSrc:   Oral   Resp:   18   Height:      Weight:      SpO2: 98% 98% 99%     Intake/Output Summary (Last  24 hours) at 04/15/13 1318 Last data filed at 04/15/13 0839  Gross per 24 hour  Intake    480 ml  Output      0 ml  Net    480 ml    Exam:  HENT:  Head: Atraumatic.  Nose: Nose normal.  Mouth/Throat: Oropharynx is clear and moist.  Eyes: Conjunctivae are normal. Pupils are equal, round, and reactive to light. No scleral icterus.  Neck: Neck supple. No tracheal deviation present.  Cardiovascular: Normal rate, regular rhythm, normal heart sounds and intact distal pulses.  Pulmonary/Chest: Effort normal and breath sounds normal. No respiratory distress.  Abdominal: Soft. Normal appearance and bowel sounds are normal. She exhibits no distension. There is no tenderness.  Musculoskeletal: She exhibits no edema and no tenderness.  Neurological: She is alert. No cranial nerve deficit.    Data Reviewed: Basic Metabolic Panel:  Recent Labs Lab 04/14/13 1149 04/15/13 0502  NA 135 140  K 3.8 4.0  CL 102 109  CO2 23 24  GLUCOSE 78 77  BUN 16 11  CREATININE 0.92 0.87  CALCIUM 8.3* 8.4    Liver Function Tests: No results found for this basename: AST, ALT, ALKPHOS, BILITOT, PROT, ALBUMIN,  in the last 168 hours No results found for this basename: LIPASE, AMYLASE,  in the last 168 hours No results found for this basename: AMMONIA,  in the last 168 hours  CBC:  Recent Labs Lab 04/14/13 1149 04/15/13 0502  WBC 6.8  4.5  NEUTROABS 5.6  --   HGB 13.4 12.2  HCT 39.2 36.4  MCV 85.0 85.8  PLT 181 166    Cardiac Enzymes:  Recent Labs Lab 04/14/13 1149 04/14/13 1658 04/14/13 2230 04/15/13 0502  TROPONINI <0.30 <0.30 <0.30 <0.30   BNP (last 3 results) No results found for this basename: PROBNP,  in the last 8760 hours   CBG:  Recent Labs Lab 04/14/13 1205  GLUCAP 74    No results found for this or any previous visit (from the past 240 hour(s)).   Studies: Ct Head Wo Contrast  April 26, 2013   *RADIOLOGY REPORT*  Clinical Data: Syncopal episode.  Suicidal.  CT HEAD  WITHOUT CONTRAST  Technique:  Contiguous axial images were obtained from the base of the skull through the vertex without contrast.  Comparison: Brain MRI 10/30/2005.  Findings: Well defined region of low attenuation in the right cerebral hemisphere involving portions of the posterior right temporal and occipital regions, most compatible with gliosis at site of the prior surgical resection.  Mild patchy and confluent areas of decreased attenuation throughout the deep and periventricular white matter of the cerebral hemispheres bilaterally, compatible with mild chronic microvascular ischemic changes. No acute intracranial abnormalities.  Specifically, no evidence of acute intracranial hemorrhage, no definite findings of acute/subacute cerebral ischemia, no mass, mass effect, hydrocephalus or abnormal intra or extra-axial fluid collections. Visualized paranasal sinuses and mastoids are well pneumatized.  No acute displaced skull fractures are identified.  Postoperative changes of right-sided tempo-occipital craniotomy redemonstrated.  IMPRESSION: 1.  No acute intracranial abnormalities. 2.  Mild chronic microvascular ischemic changes in the cerebral white matter and postoperative changes, as above.   Original Report Authenticated By: Trudie Reed, M.D.   Dg Chest Portable 1 View  26-Apr-2013   *RADIOLOGY REPORT*  Clinical Data: Compression.  Overdose.  PORTABLE CHEST - 1 VIEW  Comparison: Chest x-ray 08/25/2006.  Findings: Lung volumes are normal.  No consolidative airspace disease.  No pleural effusions.  No pneumothorax.  No pulmonary nodule or mass noted.  Pulmonary vasculature and the cardiomediastinal silhouette are within normal limits.  IMPRESSION: 1. No radiographic evidence of acute cardiopulmonary disease.   Original Report Authenticated By: Trudie Reed, M.D.    Scheduled Meds: . aspirin EC  81 mg Oral Daily  . citalopram  10 mg Oral Daily  . enoxaparin (LOVENOX) injection  30 mg Subcutaneous  Daily  . levothyroxine  50 mcg Oral QAC breakfast  . pantoprazole  40 mg Oral Daily  . sodium chloride  3 mL Intravenous Q12H   Continuous Infusions: . sodium chloride 100 mL/hr at 04/15/13 0014    Active Problems:   Syncope   Dehydration   Unspecified hypothyroidism   Orthostasis    Time spent: 40 minutes   [facility]  [facility] Pager [phone]. If 8PM-8AM, please contact night-coverage at [email or web address], password [facility] 04/15/2013, 1:18 PM  LOS: 1 day

## 2013-04-15 NOTE — Progress Notes (Signed)
  Echocardiogram 2D Echocardiogram has been performed.  [place] 04/15/2013, 5:12 PM

## 2013-04-16 MED ORDER — CITALOPRAM HYDROBROMIDE 10 MG PO TABS: 10.0000 mg | ORAL_TABLET | Freq: Every day | ORAL | Status: AC

## 2013-04-16 NOTE — Discharge Summary (Signed)
Physician Discharge Summary  Theresa Robinson MRN: [id number] DOB/AGE: December 30, 1930 79 y.o.  PCP: Ginette Otto, MD   Admit date: 04/14/2013 Discharge date: 04/16/2013  Discharge Diagnoses:       Syncope, likely vasovagal Depression   Dehydration   Unspecified hypothyroidism   Orthostasis     Medication List    TAKE these medications       citalopram 10 MG tablet  Commonly known as:  CELEXA  Take 1 tablet (10 mg total) by mouth daily.     donepezil 5 MG tablet  Commonly known as:  ARICEPT  Take 5 mg by mouth at bedtime as needed and may repeat dose one time if needed (dementia).     levothyroxine 50 MCG tablet  Commonly known as:  SYNTHROID, LEVOTHROID  Take 50 mcg by mouth daily before breakfast.        Discharge Condition: None Disposition: 01-Home or Self Care   Consults: None   Significant Diagnostic Studies: Ct Head Wo Contrast  2013-04-20   *RADIOLOGY REPORT*  Clinical Data: Syncopal episode.  Suicidal.  CT HEAD WITHOUT CONTRAST  Technique:  Contiguous axial images were obtained from the base of the skull through the vertex without contrast.  Comparison: Brain MRI 10/30/2005.  Findings: Well defined region of low attenuation in the right cerebral hemisphere involving portions of the posterior right temporal and occipital regions, most compatible with gliosis at site of the prior surgical resection.  Mild patchy and confluent areas of decreased attenuation throughout the deep and periventricular white matter of the cerebral hemispheres bilaterally, compatible with mild chronic microvascular ischemic changes. No acute intracranial abnormalities.  Specifically, no evidence of acute intracranial hemorrhage, no definite findings of acute/subacute cerebral ischemia, no mass, mass effect, hydrocephalus or abnormal intra or extra-axial fluid collections. Visualized paranasal sinuses and mastoids are well pneumatized.  No acute displaced skull fractures are  identified.  Postoperative changes of right-sided tempo-occipital craniotomy redemonstrated.  IMPRESSION: 1.  No acute intracranial abnormalities. 2.  Mild chronic microvascular ischemic changes in the cerebral white matter and postoperative changes, as above.   Original Report Authenticated By: Trudie Reed, M.D.   Dg Chest Portable 1 View  04-20-2013   *RADIOLOGY REPORT*  Clinical Data: Compression.  Overdose.  PORTABLE CHEST - 1 VIEW  Comparison: Chest x-ray 08/25/2006.  Findings: Lung volumes are normal.  No consolidative airspace disease.  No pleural effusions.  No pneumothorax.  No pulmonary nodule or mass noted.  Pulmonary vasculature and the cardiomediastinal silhouette are within normal limits.  IMPRESSION: 1. No radiographic evidence of acute cardiopulmonary disease.   Original Report Authenticated By: Trudie Reed, M.D.    2-D echo 55% - 60%  ------------------------------------------------------------ Indications: Syncope 780.2.  ------------------------------------------------------------ Study Conclusions  - Left ventricle: The cavity size was normal. There was mild focal basal hypertrophy of the septum. Systolic function was normal. The estimated ejection fraction was in the range of 55% to 60%. Wall motion was normal; there were no regional wall motion abnormalities. Left ventricular diastolic function parameters were normal. - Aortic valve: Mild regurgitation. - Mitral valve: Mild regurgitation. - Tricuspid valve: Mild-moderate regurgitation. - Pulmonary arteries: PA peak pressure: 32mm Hg (S).   Microbiology: No results found for this or any previous visit (from the past 240 hour(s)).   Labs: Results for orders placed during the hospital encounter of 04/14/13 (from the past 48 hour(s))  URINALYSIS, ROUTINE W REFLEX MICROSCOPIC     Status: Abnormal   Collection Time  04/14/13 11:48 AM      Result Value Range   Color, Urine YELLOW  YELLOW   APPearance HAZY  (*) CLEAR   Specific Gravity, Urine 1.022  1.005 - 1.030   pH 6.0  5.0 - 8.0   Glucose, UA NEGATIVE  NEGATIVE mg/dL   Hgb urine dipstick NEGATIVE  NEGATIVE   Bilirubin Urine MODERATE (*) NEGATIVE   Ketones, ur >80 (*) NEGATIVE mg/dL   Protein, ur 30 (*) NEGATIVE mg/dL   Urobilinogen, UA 1.0  0.0 - 1.0 mg/dL   Nitrite NEGATIVE  NEGATIVE   Leukocytes, UA TRACE (*) NEGATIVE  URINE MICROSCOPIC-ADD ON     Status: Abnormal   Collection Time    04/14/13 11:48 AM      Result Value Range   Squamous Epithelial / LPF FEW (*) RARE   WBC, UA 0-2  <3 WBC/hpf   RBC / HPF 0-2  <3 RBC/hpf   Bacteria, UA RARE  RARE   Casts HYALINE CASTS (*) NEGATIVE   Comment: GRANULAR CAST  CBC WITH DIFFERENTIAL     Status: Abnormal   Collection Time    04/14/13 11:49 AM      Result Value Range   WBC 6.8  4.0 - 10.5 K/uL   RBC 4.61  3.87 - 5.11 MIL/uL   Hemoglobin 13.4  12.0 - 15.0 g/dL   HCT 16.1  09.6 - 04.5 %   MCV 85.0  78.0 - 100.0 fL   MCH 29.1  26.0 - 34.0 pg   MCHC 34.2  30.0 - 36.0 g/dL   RDW 40.9  81.1 - 91.4 %   Platelets 181  150 - 400 K/uL   Neutrophils Relative % 83 (*) 43 - 77 %   Neutro Abs 5.6  1.7 - 7.7 K/uL   Lymphocytes Relative 10 (*) 12 - 46 %   Lymphs Abs 0.7  0.7 - 4.0 K/uL   Monocytes Relative 6  3 - 12 %   Monocytes Absolute 0.4  0.1 - 1.0 K/uL   Eosinophils Relative 0  0 - 5 %   Eosinophils Absolute 0.0  0.0 - 0.7 K/uL   Basophils Relative 0  0 - 1 %   Basophils Absolute 0.0  0.0 - 0.1 K/uL  BASIC METABOLIC PANEL     Status: Abnormal   Collection Time    04/14/13 11:49 AM      Result Value Range   Sodium 135  135 - 145 mEq/L   Potassium 3.8  3.5 - 5.1 mEq/L   Chloride 102  96 - 112 mEq/L   CO2 23  19 - 32 mEq/L   Glucose, Bld 78  70 - 99 mg/dL   BUN 16  6 - 23 mg/dL   Creatinine, Ser 7.82  0.50 - 1.10 mg/dL   Calcium 8.3 (*) 8.4 - 10.5 mg/dL   GFR calc non Af Amer 57 (*) >90 mL/min   GFR calc Af Amer 66 (*) >90 mL/min   Comment:            The eGFR has been  calculated     using the CKD EPI equation.     This calculation has not been     validated in all clinical     situations.     eGFR's persistently     <90 mL/min signify     possible Chronic Kidney Disease.  TROPONIN I     Status: None   Collection Time    04/14/13  11:49 AM      Result Value Range   Troponin I <0.30  <0.30 ng/mL   Comment:            Due to the release kinetics of cTnI,     a negative result within the first hours     of the onset of symptoms does not rule out     myocardial infarction with certainty.     If myocardial infarction is still suspected,     repeat the test at appropriate intervals.  GLUCOSE, CAPILLARY     Status: None   Collection Time    04/14/13 12:05 PM      Result Value Range   Glucose-Capillary 74  70 - 99 mg/dL  TROPONIN I     Status: None   Collection Time    04/14/13  4:58 PM      Result Value Range   Troponin I <0.30  <0.30 ng/mL   Comment:            Due to the release kinetics of cTnI,     a negative result within the first hours     of the onset of symptoms does not rule out     myocardial infarction with certainty.     If myocardial infarction is still suspected,     repeat the test at appropriate intervals.  TROPONIN I     Status: None   Collection Time    04/14/13 10:30 PM      Result Value Range   Troponin I <0.30  <0.30 ng/mL   Comment:            Due to the release kinetics of cTnI,     a negative result within the first hours     of the onset of symptoms does not rule out     myocardial infarction with certainty.     If myocardial infarction is still suspected,     repeat the test at appropriate intervals.  TROPONIN I     Status: None   Collection Time    04/15/13  5:02 AM      Result Value Range   Troponin I <0.30  <0.30 ng/mL   Comment:            Due to the release kinetics of cTnI,     a negative result within the first hours     of the onset of symptoms does not rule out     myocardial infarction with  certainty.     If myocardial infarction is still suspected,     repeat the test at appropriate intervals.  CBC     Status: None   Collection Time    04/15/13  5:02 AM      Result Value Range   WBC 4.5  4.0 - 10.5 K/uL   RBC 4.24  3.87 - 5.11 MIL/uL   Hemoglobin 12.2  12.0 - 15.0 g/dL   HCT 21.3  08.6 - 57.8 %   MCV 85.8  78.0 - 100.0 fL   MCH 28.8  26.0 - 34.0 pg   MCHC 33.5  30.0 - 36.0 g/dL   RDW 46.9  62.9 - 52.8 %   Platelets 166  150 - 400 K/uL  BASIC METABOLIC PANEL     Status: Abnormal   Collection Time    04/15/13  5:02 AM      Result Value Range   Sodium 140  135 - 145 mEq/L  Potassium 4.0  3.5 - 5.1 mEq/L   Chloride 109  96 - 112 mEq/L   CO2 24  19 - 32 mEq/L   Glucose, Bld 77  70 - 99 mg/dL   BUN 11  6 - 23 mg/dL   Creatinine, Ser 1.61  0.50 - 1.10 mg/dL   Calcium 8.4  8.4 - 09.6 mg/dL   GFR calc non Af Amer 61 (*) >90 mL/min   GFR calc Af Amer 71 (*) >90 mL/min   Comment:            The eGFR has been calculated     using the CKD EPI equation.     This calculation has not been     validated in all clinical     situations.     eGFR's persistently     <90 mL/min signify     possible Chronic Kidney Disease.     HPI :79 y.o. female with history hypothyroidism, last admitted 6/16 for suicide attempt with Paxil and was bradycardic at that time, who presents following a syncopal episode. She states that when she woke up this morning she was in the process of packing up [facility], but that she 'just didn't feel too good' - felt like her heart was racing , and the next thing she knew she was waking up on the floor. Per EDP patient had a witnessed syncopal episode at [facility]. Patient denies chest pain, dizziness, shortness of breath, focal weakness, nausea or vomiting and no diarrhea. She states that for a long while she's had decreased by mouth intake-just poor appetite. She was brought to the ED and was found to be  orthostatic by pulse and felt dizzy when she stood up per EDP, troponin was negative, EKG showed sinus rhythm rate of 58. She is admitted for further evaluation and management.      HOSPITAL COURSE:  Syncopal episode She was ordered of occasional episodes of bradycardia Part of the secondary to orthostasis secondary to low blood pressure No signs of underlying infection Carotid Doppler and 2-D echo were done and within normal limits Was noted to have bradycardia and the patient is on no rate controlling medications, Aricept can also cause bradycardia and the patient was made aware of this Her blood pressure tends to run low at baseline Doubt that the patient had seizure ,    Dementia Continue Aricept Patient would need outpatient followup for depression Initiated on Celexa in the hospital  Discharge Exam: * Blood pressure 107/61, pulse 76, temperature 98.1 F (36.7 C), temperature source Oral, resp. rate 16, height 5\' 1"  (1.549 m), weight 42.683 kg (94 lb 1.6 oz), SpO2 99.00%. Constitutional: Vital signs reviewed. Patient is a well-developed and well-nourished in no acute distress and cooperative with exam. Alert and oriented x3.  Head: Normocephalic and atraumatic  Nose: No erythema or drainage noted.  Mouth: no erythema or exudates, slightly dry MM  Eyes: PERRL, EOMI, conjunctivae normal, No scleral icterus.  Neck: Supple, Trachea midline normal ROM, No JVD, mass, thyromegaly, or carotid bruit present.  Cardiovascular: RRR, S1 normal, S2 normal, no MRG, pulses symmetric and intact bilaterally  Pulmonary/Chest: normal respiratory effort, CTAB, no wheezes, rales, or rhonchi  Abdominal: Soft. Non-tender, non-distended, bowel sounds are normal, no masses, organomegaly, or guarding present.  GU: no CVA tenderness Extremities: No cyanosis and no edema  Hematology: no cervical, inginal, or axillary adenopathy.  Neurological: A&O x3, Strength is normal  and symmetric bilaterally,  cranial nerve II-XII are grossly intact, no focal motor deficit, sensory intact to light touch bilaterally.  Skin: Warm, dry and intact. No rash.  Psychiatric: Normal mood and affect. speech and behavior is normal.          Discharge Orders   Future Orders Complete By Expires     Diet - low sodium heart healthy  As directed     Increase activity slowly  As directed          Signed: [facility] 04/16/2013, 10:46 AM

## 2013-04-16 NOTE — Care Management (Signed)
CM received an order for Outpatient PT services to be set up. Pt will be going to [facility] and wants to get PT services via the facility. She does not drive and feels [facility] can provide services that she needs. CM spoke to CSW [place] and she has placed PT on the [facility] and [facility] will provide PT services. No further needs from CM at this time. Thanks Eli Phillips [phone]

## 2013-04-20 NOTE — Progress Notes (Signed)
04/15/13 1143  PT G-Codes **NOT FOR INPATIENT CLASS**  Functional Assessment Tool Used clinical judgement  Functional Limitation Mobility: Walking and moving around  Mobility: Walking and Moving Around Current Status (Z6109) CI  Mobility: Walking and Moving Around Goal Status (U0454) CI  Mobility: Walking and Moving Around Discharge Status (U9811) CI  PT General Charges  $$ ACUTE PT VISIT 1 Procedure  PT Evaluation  $Initial PT Evaluation Tier I 1 Procedure  PT Treatments  $Therapeutic Activity 8-22 mins  Addition to note from 04/15/13 Delaney Meigs, PT [phone]

## 2013-04-20 DEATH — deceased

## 2014-01-26 ENCOUNTER — Other Ambulatory Visit: Payer: Self-pay | Admitting: Geriatric Medicine

## 2014-01-26 DIAGNOSIS — C719 Malignant neoplasm of brain, unspecified: Secondary | ICD-10-CM

## 2014-01-31 ENCOUNTER — Ambulatory Visit
Admission: RE | Admit: 2014-01-31 | Discharge: 2014-01-31 | Disposition: A | Discharge status: Home / Self Care | Payer: Medicare Other | Source: Ambulatory Visit | Attending: Geriatric Medicine | Admitting: Geriatric Medicine

## 2014-01-31 DIAGNOSIS — C719 Malignant neoplasm of brain, unspecified: Secondary | ICD-10-CM

## 2014-01-31 MED ORDER — GADOBENATE DIMEGLUMINE 529 MG/ML IV SOLN
8.0000 mL | Freq: Once | INTRAVENOUS | Status: AC | PRN
  Administered 2014-01-31: 8 mL via INTRAVENOUS

## 2014-02-01 ENCOUNTER — Encounter: Payer: Self-pay | Admitting: *Deleted

## 2014-02-07 ENCOUNTER — Ambulatory Visit (HOSPITAL_COMMUNITY): Payer: Medicare Other | Attending: Geriatric Medicine | Admitting: Cardiology

## 2014-02-07 ENCOUNTER — Other Ambulatory Visit (HOSPITAL_COMMUNITY): Payer: Self-pay | Admitting: Geriatric Medicine

## 2014-02-07 DIAGNOSIS — I359 Nonrheumatic aortic valve disorder, unspecified: Secondary | ICD-10-CM | POA: Insufficient documentation

## 2014-02-07 NOTE — Progress Notes (Signed)
Echo performed. 

## 2014-07-17 ENCOUNTER — Encounter: Payer: Self-pay | Admitting: *Deleted

## 2014-12-20 ENCOUNTER — Encounter: Payer: Medicare Other | Admitting: Internal Medicine

## 2015-12-28 ENCOUNTER — Ambulatory Visit: Payer: PRIVATE HEALTH INSURANCE | Admitting: Neurology

## 2015-12-28 ENCOUNTER — Telehealth: Payer: Self-pay | Admitting: *Deleted

## 2015-12-28 NOTE — Telephone Encounter (Signed)
No show - new patient appt - canceled less than 24 hours - sick.

## 2016-01-04 ENCOUNTER — Ambulatory Visit (INDEPENDENT_AMBULATORY_CARE_PROVIDER_SITE_OTHER): Payer: Medicare Other | Admitting: Neurology

## 2016-01-04 ENCOUNTER — Encounter: Payer: Self-pay | Admitting: Neurology

## 2016-01-04 VITALS — BP 125/66 | HR 70 | Ht 61.0 in | Wt 94.0 lb

## 2016-01-04 DIAGNOSIS — Z9889 Other specified postprocedural states: Secondary | ICD-10-CM | POA: Diagnosis not present

## 2016-01-04 DIAGNOSIS — R269 Unspecified abnormalities of gait and mobility: Secondary | ICD-10-CM | POA: Diagnosis not present

## 2016-01-04 DIAGNOSIS — F0390 Unspecified dementia without behavioral disturbance: Secondary | ICD-10-CM | POA: Insufficient documentation

## 2016-01-04 MED ORDER — CARBIDOPA-LEVODOPA 25-100 MG PO TABS: 1.0000 | ORAL_TABLET | Freq: Three times a day (TID) | ORAL | Status: AC

## 2016-01-04 NOTE — Progress Notes (Signed)
PATIENT: Theresa Robinson DOB: 27-Jul-1931  Chief Complaint  Patient presents with  . Gait Difficulty    She is here with her sister-in-law, Theresa Robinson.  Reports she has been having increased difficulty when walking.  She has a shuffling gait and weakness in her legs.  . Memory Loss    MMSE 21/30 - 5 animals.  Her family has noticed a decline in memory.  She is currently taking Namenda XR 28mg  and Aricept 10mg  daily.     HISTORICAL  Theresa Robinson is 82 years old right-handed female, accompanied by his sister-in-law Theresa Robinson, seen in refer by his primary care physician Dr. Merlene Laughter, in January 04 2016 for evaluation of gait difficulty, memory loss, she [facility]  She had a past medical history of depression, hypothyroidism, on supplement.  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  She was not not able to provide extensive medical history, review medical record showed she had a history of right occipitoparietal lobectomy in 2005, he was not able to find a detailed pathology report.  I personally reviewed most recent MRI of the brain with without contrast in April 2015, there was no acute abnormality, stable appearance of the right occipitoparietal encephalomalacia following resection of the right occipital lobe tumor. Interval progression of moderate generalized atrophy and white matter disease.   She was noted to have gradual onset memory trouble since 2011, she was one of the 9 siblings, there was no significant family history of dementia, she was divorced in 2002, lived at different places, she [facility] to [facility] in April [facility], she was able to ambulate, putting off her belongings when she first moved in, but since September 2016, she was noted to have worsening memory trouble, significant gait difficulty, bending over, now she spent  most of the time sitting down, wear pull-ups, could not make to the bathroom,  She has suffered severe depression around 2014, was overdosed on Paxil,   Echocardiogram 2015, ejection fraction 65%,  Ultrasound of carotid artery: No significant bilateral carotid artery stenosis.  REVIEW OF SYSTEMS: Full 14 system review of systems performed and notable only for  memory loss, confusion  ALLERGIES: No Known Allergies  HOME MEDICATIONS: Current Outpatient Prescriptions  Medication Sig Dispense Refill  . alendronate (FOSAMAX) 70 MG tablet Take 70 mg by mouth once a week. Take with a full glass of water on an empty stomach.    . Calcium Citrate-Vitamin D 315-250 MG-UNIT TABS Take by mouth daily.    . cetaphil (CETAPHIL) cream Apply topically daily.    . citalopram (CELEXA) 10 MG tablet Take 1 tablet (10 mg total) by mouth daily. 30 tablet 0  . divalproex (DEPAKOTE SPRINKLE) 125 MG capsule Take by mouth 2 (two) times daily.    Marland Kitchen donepezil (ARICEPT) 10 MG tablet Take 10 mg by mouth at bedtime.    . ergocalciferol (VITAMIN D2) 50000 units capsule Take 50,000 Units by mouth once a week.    Marland Kitchen ipratropium (ATROVENT) 0.03 % nasal spray Place 2 sprays into both nostrils 3 (three) times daily.    Marland Kitchen levothyroxine (SYNTHROID, LEVOTHROID) 50 MCG tablet Take 50 mcg by mouth daily before breakfast.    . Memantine HCl ER (NAMENDA XR) 28 MG CP24 Take by mouth.    . mirtazapine (REMERON) 15 MG tablet Take 15 mg by mouth  at bedtime.     No current facility-administered medications for this visit.    PAST MEDICAL HISTORY: Past Medical History  Diagnosis Date  . Dementia   . Diabetes mellitus without complication ([place])   . Thyroid disease     hypothyroidism  . Depression 04/04/2013    attempted suicida over dose Paxil  . Diverticulosis   . Osteoporosis   . Gait difficulty     PAST SURGICAL HISTORY: Past Surgical History  Procedure Laterality Date  . Cataract extraction w/ intraocular lens implant  Left     FAMILY HISTORY: Family History  Problem Relation Age of Onset  . Stroke Father   . Cancer Mother     bladder  . Cancer Sister     brain  . Fibromyalgia Sister   . Macular degeneration Sister   . Leukemia Brother   . Cancer Sister     breast  . Fibromyalgia Sister   . Mental illness Son     SOCIAL HISTORY:  Social History   Social History  . Marital Status: Divorced    Spouse Name: N/A  . Number of Children: 0  . Years of Education: 12   Occupational History  . retired Dealer    Social History Main Topics  . Smoking status: Never Smoker   . Smokeless tobacco: Not on file  . Alcohol Use: No  . Drug Use: No  . Sexual Activity: No   Other Topics Concern  . Not on file   Social History Narrative   Lives at [facility].   Right-handed.   No caffeine use.   Divorced   Never smoked   Alcohol none   Exercise none   Does not drive           PHYSICAL EXAM   Filed Vitals:   01/04/16 1309  BP: 125/66  Pulse: 70  Height: 5\' 1"  (1.549 m)  Weight: 94 lb (42.638 kg)    Not recorded      Body mass index is 17.77 kg/(m^2).  PHYSICAL EXAMNIATION:  Gen: NAD, conversant, well nourised, obese, well groomed                     Cardiovascular: Regular rate rhythm, no peripheral edema, warm, nontender. Eyes: Conjunctivae clear without exudates or hemorrhage Neck: Supple, no carotid bruise. Pulmonary: Clear to auscultation bilaterally   NEUROLOGICAL EXAM:  MENTAL STATUS: Speech:    Speech is normal; fluent and spontaneous with normal comprehension.  Cognition:Mini-Mental Status Examination is 21/30, animal naming 5,Depressed-looking elderly female     Orientation: She is not oriented to date, month, date, season, clinic,     Recent and remote memory: She missed 3 out of 3 recalls     Normal Attention span and concentration     Normal Language, naming, repeating,spontaneous speech     Fund of knowledge     CRANIAL NERVES: CN II: Visual fields are full to confrontation. Fundoscopic exam is normal with sharp discs and no vascular changes. Pupils are round equal and briskly reactive to light. CN III, IV, VI: extraocular movement are normal. No ptosis. CN V: Facial sensation is intact to pinprick in all 3 divisions bilaterally. Corneal responses are intact.  CN VII: Face is symmetric with normal eye closure and smile. CN VIII: Hearing is normal to rubbing fingers CN IX, X: Palate elevates symmetrically. Phonation is normal. CN XI: Head turning and shoulder shrug are intact CN XII: Tongue is  midline with normal movements and no atrophy.  MOTOR: She has moderate fairly symmetric bilateral limb, nuchal rigidity, no significant weakness, early fatigability on rapid wrist opening, closing, finger tapping, foot tapping.  REFLEXES: Reflexes are 2+ and symmetric at the biceps, triceps, knees, and ankles. Plantar responses are flexor.  SENSORY: Intact to light touch, pinprick, position sense, and vibration sense are intact in fingers and toes.  COORDINATION: Rapid alternating movements and fine finger movements are intact. There is no dysmetria on finger-to-nose and heel-knee-shin.    GAIT/STANCE: She needs to push on chair arm to get up from seated position, bending forward, wide-based small shuffling gait, difficulty initiate gait,  DIAGNOSTIC DATA (LABS, IMAGING, TESTING) - I reviewed patient records, labs, notes, testing and imaging myself where available.   ASSESSMENT AND PLAN  Theresa Robinson is a 82 y.o. female   History of right occipital parietal lobectomy  Continued evidence of right occipitoparietal encephalomalacia Acute worsening memory loss in the setting of mild progressive dementia Acute worsening gait difficulty  She has mild parkinsonian features,  Most likely central nervous system degenerative disorder, which is also complicated by her depression, potential long-term  neurotrophic medication treatment,  But patient and her family was not able to provide a detailed medication list.  Try Sinemet 25/100 mg 3 times a day  Laboratory evaluations  Repeat MRI of the brain with without contrast  I have suggested physical therapy, occupational therapy,    Marcial Pacas, M.D. Ph.D.  [facility] [address], [place], [place] [address] Ph: [phone] Fax: [phone]  [id number] Elroy Channel, MD

## 2016-01-05 LAB — VITAMIN B12: Vitamin B-12: 638 pg/mL (ref 211–946)

## 2016-01-05 LAB — COMPREHENSIVE METABOLIC PANEL
ALK PHOS: 97 IU/L (ref 39–117)
ALT: 20 IU/L (ref 0–32)
AST: 24 IU/L (ref 0–40)
Albumin/Globulin Ratio: 1.7 (ref 1.2–2.2)
Albumin: 4.5 g/dL (ref 3.5–4.7)
BUN/Creatinine Ratio: 16 (ref 11–26)
BUN: 16 mg/dL (ref 8–27)
Bilirubin Total: 0.3 mg/dL (ref 0.0–1.2)
CO2: 25 mmol/L (ref 18–29)
CREATININE: 0.97 mg/dL (ref 0.57–1.00)
Calcium: 10 mg/dL (ref 8.7–10.3)
Chloride: 102 mmol/L (ref 96–106)
GFR calc Af Amer: 63 mL/min/{1.73_m2} (ref >59)
GFR, EST NON AFRICAN AMERICAN: 55 mL/min/{1.73_m2} — AB (ref >59)
GLOBULIN, TOTAL: 2.7 g/dL (ref 1.5–4.5)
Glucose: 78 mg/dL (ref 65–99)
POTASSIUM: 4.5 mmol/L (ref 3.5–5.2)
SODIUM: 145 mmol/L — AB (ref 134–144)
Total Protein: 7.2 g/dL (ref 6.0–8.5)

## 2016-01-05 LAB — CBC
Hematocrit: 43.7 % (ref 34.0–46.6)
Hemoglobin: 14.1 g/dL (ref 11.1–15.9)
MCH: 29.3 pg (ref 26.6–33.0)
MCHC: 32.3 g/dL (ref 31.5–35.7)
MCV: 91 fL (ref 79–97)
PLATELETS: 196 10*3/uL (ref 150–379)
RBC: 4.81 x10E6/uL (ref 3.77–5.28)
RDW: 14.3 % (ref 12.3–15.4)
WBC: 5.6 10*3/uL (ref 3.4–10.8)

## 2016-01-05 LAB — THYROID PANEL WITH TSH
FREE THYROXINE INDEX: 2.5 (ref 1.2–4.9)
T3 UPTAKE RATIO: 27 % (ref 24–39)
T4 TOTAL: 9.2 ug/dL (ref 4.5–12.0)
TSH: 4.01 u[IU]/mL (ref 0.450–4.500)

## 2016-01-05 LAB — RPR: RPR Ser Ql: NONREACTIVE

## 2016-01-05 LAB — SEDIMENTATION RATE: SED RATE: 10 mm/h (ref 0–40)

## 2016-01-05 LAB — CK: Total CK: 72 U/L (ref 24–173)

## 2016-01-05 LAB — C-REACTIVE PROTEIN: CRP: 7.1 mg/L — ABNORMAL HIGH (ref 0.0–4.9)

## 2016-01-05 LAB — FOLATE: FOLATE: 19.3 ng/mL (ref >3.0)

## 2016-01-29 ENCOUNTER — Other Ambulatory Visit: Payer: PRIVATE HEALTH INSURANCE

## 2016-01-30 ENCOUNTER — Other Ambulatory Visit: Payer: PRIVATE HEALTH INSURANCE

## 2016-02-12 ENCOUNTER — Ambulatory Visit: Payer: Medicare Other | Admitting: Neurology

## 2016-02-20 ENCOUNTER — Ambulatory Visit: Payer: Medicare Other | Admitting: Neurology

## 2016-02-22 ENCOUNTER — Ambulatory Visit: Payer: Medicare Other | Admitting: Neurology

## 2016-04-16 ENCOUNTER — Emergency Department (HOSPITAL_COMMUNITY)
Admission: EM | Admit: 2016-04-16 | Discharge: 2016-04-16 | Payer: Medicare Other | Attending: Emergency Medicine | Admitting: Emergency Medicine

## 2016-04-16 ENCOUNTER — Encounter (HOSPITAL_COMMUNITY): Payer: Self-pay | Admitting: Emergency Medicine

## 2016-04-16 ENCOUNTER — Emergency Department (HOSPITAL_COMMUNITY): Payer: Medicare Other

## 2016-04-16 DIAGNOSIS — E119 Type 2 diabetes mellitus without complications: Secondary | ICD-10-CM | POA: Diagnosis not present

## 2016-04-16 DIAGNOSIS — Z79899 Other long term (current) drug therapy: Secondary | ICD-10-CM | POA: Diagnosis not present

## 2016-04-16 DIAGNOSIS — F0390 Unspecified dementia without behavioral disturbance: Secondary | ICD-10-CM | POA: Insufficient documentation

## 2016-04-16 DIAGNOSIS — F332 Major depressive disorder, recurrent severe without psychotic features: Secondary | ICD-10-CM | POA: Insufficient documentation

## 2016-04-16 DIAGNOSIS — R45851 Suicidal ideations: Secondary | ICD-10-CM | POA: Insufficient documentation

## 2016-04-16 DIAGNOSIS — Z046 Encounter for general psychiatric examination, requested by authority: Secondary | ICD-10-CM | POA: Diagnosis present

## 2016-04-16 DIAGNOSIS — M818 Other osteoporosis without current pathological fracture: Secondary | ICD-10-CM | POA: Diagnosis not present

## 2016-04-16 LAB — RAPID URINE DRUG SCREEN, HOSP PERFORMED
AMPHETAMINES: NOT DETECTED
BARBITURATES: NOT DETECTED
BENZODIAZEPINES: NOT DETECTED
COCAINE: NOT DETECTED
Opiates: NOT DETECTED
Tetrahydrocannabinol: NOT DETECTED

## 2016-04-16 LAB — CBC WITH DIFFERENTIAL/PLATELET
BASOS ABS: 0 10*3/uL (ref 0.0–0.1)
BASOS PCT: 0 %
Eosinophils Absolute: 0.2 10*3/uL (ref 0.0–0.7)
Eosinophils Relative: 3 %
HEMATOCRIT: 43.4 % (ref 36.0–46.0)
HEMOGLOBIN: 14.9 g/dL (ref 12.0–15.0)
LYMPHS PCT: 15 %
Lymphs Abs: 0.8 10*3/uL (ref 0.7–4.0)
MCH: 28.8 pg (ref 26.0–34.0)
MCHC: 34.3 g/dL (ref 30.0–36.0)
MCV: 83.9 fL (ref 78.0–100.0)
MONO ABS: 0.6 10*3/uL (ref 0.1–1.0)
Monocytes Relative: 11 %
NEUTROS ABS: 3.9 10*3/uL (ref 1.7–7.7)
NEUTROS PCT: 71 %
Platelets: 153 10*3/uL (ref 150–400)
RBC: 5.17 MIL/uL — AB (ref 3.87–5.11)
RDW: 14.3 % (ref 11.5–15.5)
WBC: 5.5 10*3/uL (ref 4.0–10.5)

## 2016-04-16 LAB — URINALYSIS, ROUTINE W REFLEX MICROSCOPIC
GLUCOSE, UA: NEGATIVE mg/dL
Hgb urine dipstick: NEGATIVE
LEUKOCYTES UA: NEGATIVE
NITRITE: NEGATIVE
PH: 6 (ref 5.0–8.0)
PROTEIN: NEGATIVE mg/dL
Specific Gravity, Urine: 1.028 (ref 1.005–1.030)

## 2016-04-16 LAB — COMPREHENSIVE METABOLIC PANEL
ALBUMIN: 4.1 g/dL (ref 3.5–5.0)
ALK PHOS: 63 U/L (ref 38–126)
ALT: <5 U/L — ABNORMAL LOW (ref 14–54)
ANION GAP: 14 (ref 5–15)
AST: 21 U/L (ref 15–41)
BUN: 28 mg/dL — ABNORMAL HIGH (ref 6–20)
CALCIUM: 10.3 mg/dL (ref 8.9–10.3)
CHLORIDE: 100 mmol/L — AB (ref 101–111)
CO2: 25 mmol/L (ref 22–32)
Creatinine, Ser: 0.9 mg/dL (ref 0.44–1.00)
GFR calc non Af Amer: 58 mL/min — ABNORMAL LOW (ref >60)
GLUCOSE: 74 mg/dL (ref 65–99)
POTASSIUM: 4 mmol/L (ref 3.5–5.1)
Sodium: 139 mmol/L (ref 135–145)
Total Bilirubin: 0.9 mg/dL (ref 0.3–1.2)
Total Protein: 7.3 g/dL (ref 6.5–8.1)

## 2016-04-16 LAB — ACETAMINOPHEN LEVEL

## 2016-04-16 LAB — VALPROIC ACID LEVEL: Valproic Acid Lvl: 43 ug/mL — ABNORMAL LOW (ref 50.0–100.0)

## 2016-04-16 LAB — SALICYLATE LEVEL: Salicylate Lvl: <4 mg/dL (ref 2.8–30.0)

## 2016-04-16 LAB — TROPONIN I

## 2016-04-16 MED ORDER — CHOLECALCIFEROL 10 MCG (400 UNIT) PO TABS: 200.0000 [IU] | ORAL_TABLET | Freq: Every day | ORAL | Status: DC

## 2016-04-16 MED ORDER — MEMANTINE HCL ER 28 MG PO CP24: 28.0000 mg | ORAL_CAPSULE | Freq: Every day | ORAL | Status: DC

## 2016-04-16 MED ORDER — DIVALPROEX SODIUM 125 MG PO CSDR
125.0000 mg | DELAYED_RELEASE_CAPSULE | Freq: Two times a day (BID) | ORAL | Status: DC
  Filled 2016-04-16: qty 1

## 2016-04-16 MED ORDER — MEGESTROL ACETATE 400 MG/10ML PO SUSP: 800.0000 mg | Freq: Every day | ORAL | Status: DC

## 2016-04-16 MED ORDER — DONEPEZIL HCL 5 MG PO TABS: 10.0000 mg | ORAL_TABLET | Freq: Every day | ORAL | Status: DC

## 2016-04-16 MED ORDER — CALCIUM CITRATE 950 (200 CA) MG PO TABS: 200.0000 mg | ORAL_TABLET | Freq: Every day | ORAL | Status: DC

## 2016-04-16 MED ORDER — MIRTAZAPINE 30 MG PO TABS: 15.0000 mg | ORAL_TABLET | Freq: Every day | ORAL | Status: DC

## 2016-04-16 MED ORDER — CARBIDOPA-LEVODOPA 25-100 MG PO TABS
1.0000 | ORAL_TABLET | Freq: Three times a day (TID) | ORAL | Status: DC
  Filled 2016-04-16: qty 1

## 2016-04-16 MED ORDER — ALENDRONATE SODIUM 70 MG PO TABS: 70.0000 mg | ORAL_TABLET | ORAL | Status: DC

## 2016-04-16 MED ORDER — CALCIUM CITRATE-VITAMIN D 315-250 MG-UNIT PO TABS: 1.0000 | ORAL_TABLET | Freq: Every day | ORAL | Status: DC

## 2016-04-16 MED ORDER — LEVOTHYROXINE SODIUM 50 MCG PO TABS
50.0000 ug | ORAL_TABLET | Freq: Every day | ORAL | Status: DC
  Filled 2016-04-16: qty 1

## 2016-04-16 MED ORDER — IPRATROPIUM BROMIDE 0.03 % NA SOLN
2.0000 | Freq: Three times a day (TID) | NASAL | Status: DC
  Filled 2016-04-16: qty 30

## 2016-04-16 MED ORDER — CITALOPRAM HYDROBROMIDE 10 MG PO TABS: 40.0000 mg | ORAL_TABLET | Freq: Every day | ORAL | Status: DC

## 2016-04-16 MED ORDER — ACETAMINOPHEN 325 MG PO TABS: 650.0000 mg | ORAL_TABLET | Freq: Three times a day (TID) | ORAL | Status: DC | PRN

## 2016-04-16 NOTE — ED Notes (Signed)
Tech drawing labs 

## 2016-04-16 NOTE — ED Notes (Signed)
[facility]: pt from the [facility] with dementia who is reported to be refusing food and water stating that "I want to die." Pt is alert and oriented to self and situation. Family is present.

## 2016-04-16 NOTE — ED Notes (Signed)
Pt denies pain when asked. She states "I want to but I wouldn't kill myself, I wouldn't know how." when asked if she was SI.

## 2016-04-16 NOTE — Progress Notes (Addendum)
CSW reached out to [facility] who states that patient has been accepted to their facility and is welcomed to come.  Accepting Physician: Sondra Barges Room: 427-B Nurse Report Number: [phone]  CSW made nurse aware.  Willette Brace [id number] ED CSW 04/16/2016 8:04 PM

## 2016-04-16 NOTE — Progress Notes (Signed)
PHARMACIST - PHYSICIAN COMMUNICATION  CONCERNING: P&T Medication Policy Regarding Oral Bisphosphonates  RECOMMENDATION: Your order for alendronate (Fosamax), ibandronate (Boniva), or risedronate (Actonel) has been discontinued at this time.  If the patient's post-hospital medical condition warrants safe use of this class of drugs, please resume the pre-hospital regimen upon discharge.  DESCRIPTION:  Alendronate (Fosamax), ibandronate (Boniva), and risedronate (Actonel) can cause severe esophageal erosions in patients who are unable to remain upright at least 30 minutes after taking this medication.   Since brief interruptions in therapy are thought to have minimal impact on bone mineral density, the [facility] has established that bisphosphonate orders should be routinely discontinued during hospitalization.   To override this safety policy and permit administration of Boniva, Fosamax, or Actonel in the hospital, prescribers must write "DO NOT HOLD" in the comments section when placing the order for this class of medications.  Doreene Eland, PharmD, BCPS.   04/16/2016 6:09 PM

## 2016-04-16 NOTE — ED Notes (Signed)
Meal tray at bedside. Will set up and assist pt when they return from [place]

## 2016-04-16 NOTE — BHH Counselor (Signed)
Per May, NP patient meets criteria for [facility] treatment. Patient referred to the following facilities for review:  [facility], [facility], [facility], [facility], [facility], [facility], [facility], and [facility].

## 2016-04-16 NOTE — ED Notes (Signed)
Bed: WA14 Expected date:  Expected time:  Means of arrival:  Comments: EMS 

## 2016-04-16 NOTE — BH Assessment (Addendum)
Assessment Note  Theresa Robinson is an 82 y.o. female with history of dementia, depression, and suicide attempt. She presents to [facility] with her sister in law (POA) Raylene Miyamoto. Patient with increased depression and suicidal thoughts over the past year. She has loss of interest in usual pleasures, fatigue, crying spells, and hopelessness. Patient lives at [facility] and has not left her room for the past month due to isolating self from others. Patient's appetite is poor. She admits to weight loss bu unsure of the exact amount of weight that she has loss. She does not sleep well and was unable to provide an exact amount of hours that she sleeps per night. She admits to suicidal thoughts off and on for several years. She denies a suicidal plan. She denies access to means. She denies prior suicide attempts or gestures. However, base upon medical record patient overdosed on Paxil taking 10-12 Paxil. The prior event was triggered by loss of her drivers license due to dementia. Patient became upset at that time because not having her license prevented her from visiting her son. Patient's son at that time was dx's with MR and admitted to [facility] [place]. Patient has no clear intent to end her life but sts several times she wouldn't care if she died. Patient's asked about her stressors and she stated, "Feeling like a burden" and "Feeling as if I'm in the way". She reports mild feelings of anxiety. She denies HI. No legal issues. She is calm and cooperative; polite. No AVH's. No alcohol and drug use reported. Patient denies having a therapist and/or psychiatrist. Patient asked if she has a history of INPT hospitalizations. She sts, "I think so....maybe a long time ago.. but I am not sure". Patient does not have a history of abuse. She denies a family history of mental health illness. Patient has a POA (dual) Raylene Miyamoto, also her sister in law [phone] and [phone]. Patient needs assistance with  most of her ADL's (dressing, showering, eating, etc.) She is able to walk but the sister in law reports that she is limited and uses a walker typically. However, over the past 3 weeks patient has not utilized her walker at all and lays in her bed mostly. Patient is oriented to person, place, time, and situation.    Diagnosis:  Major Depressive Disorder, Recurrent, Severe, without psychotic features; Dementia Disorder ([place])  Past Medical History:  Past Medical History  Diagnosis Date  . Dementia   . Diabetes mellitus without complication ([place])   . Thyroid disease     hypothyroidism  . Depression 04/02/2013    attempted suicida over dose Paxil  . Diverticulosis   . Osteoporosis   . Gait difficulty     Past Surgical History  Procedure Laterality Date  . Cataract extraction w/ intraocular lens implant Left     Family History:  Family History  Problem Relation Age of Onset  . Stroke Father   . Cancer Mother     bladder  . Cancer Sister     brain  . Fibromyalgia Sister   . Macular degeneration Sister   . Leukemia Brother   . Cancer Sister     breast  . Fibromyalgia Sister   . Mental illness Son     Social History:  reports that she has never smoked. She does not have any smokeless tobacco history on file. She reports that she does not drink alcohol or use illicit drugs.  Additional Social History:  Alcohol / Drug Use Pain Medications: see PTA meds list Prescriptions: see PTA meds list Over the Counter: see PTA meds list History of alcohol / drug use?: No history of alcohol / drug abuse Longest period of sobriety (when/how long): n/a  CIWA: CIWA-Ar BP: 112/59 mmHg Pulse Rate: 80 COWS:    Allergies: No Known Allergies  Home Medications:  (Not in a hospital admission)  OB/GYN Status:  No LMP recorded. Patient is postmenopausal.  General Assessment Data Location of Assessment: [facility] TTS Assessment: In system Is this a Tele or Face-to-Face Assessment?:  Face-to-Face Is this an Initial Assessment or a Re-assessment for this encounter?: Initial Assessment Marital status: Single Maiden name:  (n/a) Is patient pregnant?: No Pregnancy Status: No Living Arrangements: Other (Comment) (Lives at [facility] ) Can pt return to current living arrangement?: Yes Admission Status: Voluntary Is patient capable of signing voluntary admission?: Yes Referral Source: Self/Family/Friend Insurance type:  Passenger transport manager and Administrator, [employer])  Medical Screening Exam ([place]) Medical Exam completed: No  Crisis Care Plan Living Arrangements: Other (Comment) (Lives at [facility] ) Legal Guardian:  (no legal guardian ) Name of Psychiatrist:  (no psychiatrist ) Name of Therapist:  (no therapist )  Education Status Is patient currently in school?: No Current Grade:  (n/a) Highest grade of school patient has completed:  (n/a) Name of school:  (n/a) Contact person:  (n/a)  Risk to self with the past 6 months Suicidal Ideation: Yes-Currently Present Has patient been a risk to self within the past 6 months prior to admission? : Yes Suicidal Intent: Yes-Currently Present Has patient had any suicidal intent within the past 6 months prior to admission? : Yes Is patient at risk for suicide?: Yes Suicidal Plan?: No Has patient had any suicidal plan within the past 6 months prior to admission? : No Access to Means: No What has been your use of drugs/alcohol within the last 12 months?:  (patient denies alcohol and drug use) Previous Attempts/Gestures: No How many times?:  (0) Other Self Harm Risks:  (patient denies ) Triggers for Past Attempts:  (no previous attempts and/or gestures) Intentional Self Injurious Behavior: None Family Suicide History: No Recent stressful life event(s): Other (Comment), Recent negative physical changes ("I feel like a burden.Marland KitchenMarland KitchenMarland KitchenI feel like I'm in the way") Persecutory voices/beliefs?: No Depression: Yes Depression Symptoms:  Feeling angry/irritable, Feeling worthless/self pity, Loss of interest in usual pleasures, Guilt, Fatigue, Isolating, Tearfulness, Insomnia, Despondent Substance abuse history and/or treatment for substance abuse?: No Suicide prevention information given to non-admitted patients: Not applicable  Risk to Others within the past 6 months Homicidal Ideation: No Does patient have any lifetime risk of violence toward others beyond the six months prior to admission? : No Thoughts of Harm to Others: No Current Homicidal Intent: No Current Homicidal Plan: No Access to Homicidal Means: No Identified Victim:  (n/a) History of harm to others?: No Assessment of Violence: None Noted Violent Behavior Description:  (patient is calm and cooperative ) Does patient have access to weapons?: No Criminal Charges Pending?: No Does patient have a court date: No Is patient on probation?: No  Psychosis Hallucinations: None noted Delusions: None noted  Mental Status Report Appearance/Hygiene: In hospital gown Eye Contact: Good Motor Activity: Freedom of movement Speech: Logical/coherent Level of Consciousness: Alert Mood: Depressed Affect: Sad Anxiety Level: Minimal Thought Processes: Relevant, Coherent Judgement: Impaired Orientation: Person, Time, Situation, Place Obsessive Compulsive Thoughts/Behaviors: None  Cognitive Functioning Concentration: Decreased Memory: Recent Intact, Remote Intact IQ: Average  Insight: Fair Impulse Control: Good Appetite: Poor Weight Loss:  (pt reports weight loss but unsure of how much ) Weight Gain:  (denies ) Sleep: Decreased Total Hours of Sleep:  ("I don't hardly every sleep") Vegetative Symptoms: None  ADLScreening [facility]) Patient's cognitive ability adequate to safely complete daily activities?: Yes Patient able to express need for assistance with ADLs?: Yes Independently performs ADLs?: No  Prior Inpatient Therapy Prior Inpatient  Therapy:  ("I may have been hospitalized when I was younger but I'm not) Prior Therapy Dates:  (unk) Prior Therapy Facilty/Provider(s):  (unk; "I may have been hospitalized but not sure") Reason for Treatment:  ("I can't remember")  Prior Outpatient Therapy Prior Outpatient Therapy: No Prior Therapy Dates:  (n/a) Prior Therapy Facilty/Provider(s):  (n/a) Reason for Treatment:  (n/a) Does patient have an ACCT team?: No Does patient have Intensive In-House Services?  : No Does patient have [facility] services? : No Does patient have P4CC services?: No  ADL Screening (condition at time of admission) Patient's cognitive ability adequate to safely complete daily activities?: Yes Is the patient deaf or have difficulty hearing?: No Does the patient have difficulty seeing, even when wearing glasses/contacts?: No Does the patient have difficulty concentrating, remembering, or making decisions?: No Patient able to express need for assistance with ADLs?: Yes Does the patient have difficulty dressing or bathing?: Yes Independently performs ADLs?: No Communication: Independent Dressing (OT): Needs assistance Grooming: Needs assistance Feeding: Needs assistance Bathing: Needs assistance Toileting: Needs assistance In/Out Bed: Needs assistance Walks in Home: Independent with device (comment) Does the patient have difficulty walking or climbing stairs?: No Weakness of Legs: None Weakness of Arms/Hands: None  Home Assistive Devices/Equipment Home Assistive Devices/Equipment: None    Abuse/Neglect Assessment (Assessment to be complete while patient is alone) Physical Abuse: Denies Verbal Abuse: Denies Sexual Abuse: Denies Exploitation of patient/patient's resources: Denies Self-Neglect: Denies Values / Beliefs Cultural Requests During Hospitalization: None Spiritual Requests During Hospitalization: None   Advance Directives (For Healthcare) Does patient have an advance directive?:  No Would patient like information on creating an advanced directive?: No - patient declined information Type of Advance Directive: Healthcare Power of Attorney, Out of facility DNR (pink MOST or yellow form) Pre-existing out of facility DNR order (yellow form or pink MOST form): Pink MOST form placed in chart (order not valid for inpatient use) Does patient want to make changes to advanced directive?: No - Patient declined Nutrition Screen- [facility] Adult/[facility] Patient's home diet: Regular  Additional Information 1:1 In Past 12 Months?: No     Disposition:  Disposition Initial Assessment Completed for this Encounter: Yes Disposition of Patient: Inpatient treatment program (meets INPT criteria per May, NP; pending [facility] placement) Type of inpatient treatment program: Adult  On Site Evaluation by:   Reviewed with Physician:     Waldon Merl [facility] 04/16/2016 2:30 PM

## 2016-04-16 NOTE — ED Notes (Signed)
Pt drinking a coke

## 2016-04-16 NOTE — ED Provider Notes (Addendum)
CSN: [id number]     Arrival date & time 04/16/16  1236 History   First MD Initiated Contact with Patient 04/16/16 1302     Chief Complaint  Patient presents with  . Dementia  . Psychiatric Evaluation     (Consider location/radiation/quality/duration/timing/severity/associated sxs/prior Treatment) HPI Patient is sent from [facility] was reported refusing food and water. Reported she stated "I want to die". Patient reports that she has no appetite. She endorses being depressed but doesn't elaborate on causes. She reports she is thought about killing herself but wouldn't be able to follow through on it. She endorses feeling "poorly" but denies any focal symptoms. Past Medical History  Diagnosis Date  . Dementia   . Diabetes mellitus without complication ([place])   . Thyroid disease     hypothyroidism  . Depression 03/21/2013    attempted suicida over dose Paxil  . Diverticulosis   . Osteoporosis   . Gait difficulty    Past Surgical History  Procedure Laterality Date  . Cataract extraction w/ intraocular lens implant Left    Family History  Problem Relation Age of Onset  . Stroke Father   . Cancer Mother     bladder  . Cancer Sister     brain  . Fibromyalgia Sister   . Macular degeneration Sister   . Leukemia Brother   . Cancer Sister     breast  . Fibromyalgia Sister   . Mental illness Son    Social History  Substance Use Topics  . Smoking status: Never Smoker   . Smokeless tobacco: None  . Alcohol Use: No   OB History    No data available     Review of Systems  10 Systems reviewed and are negative for acute change except as noted in the HPI.   Allergies  Review of patient's allergies indicates no known allergies.  Home Medications   Prior to Admission medications   Medication Sig Start Date End Date Taking? Authorizing Provider  acetaminophen (TYLENOL) 325 MG tablet Take 650 mg by mouth every 8 (eight) hours as needed (for pain).    Yes  Historical Provider, MD  alendronate (FOSAMAX) 70 MG tablet Take 70 mg by mouth every Saturday. Take with a full glass of water on an empty stomach.   Yes Historical Provider, MD  Calcium Citrate-Vitamin D 315-250 MG-UNIT TABS Take 1 tablet by mouth daily.    Yes Historical Provider, MD  carbidopa-levodopa (SINEMET IR) 25-100 MG tablet Take 1 tablet by mouth 3 (three) times daily. 01/04/16  Yes Marcial Pacas, MD  cetaphil (CETAPHIL) cream Apply 1 application topically daily. Applies to face   Yes Historical Provider, MD  citalopram (CELEXA) 40 MG tablet Take 40 mg by mouth daily.   Yes Historical Provider, MD  Dextromethorphan-Guaifenesin (ROBAFEN DM) 10-100 MG/5ML liquid Take 5 mLs by mouth every 8 (eight) hours as needed (for cough).    Yes Historical Provider, MD  divalproex (DEPAKOTE SPRINKLE) 125 MG capsule Take 125 mg by mouth 2 (two) times daily.    Yes Historical Provider, MD  donepezil (ARICEPT) 10 MG tablet Take 10 mg by mouth at bedtime.   Yes Historical Provider, MD  ergocalciferol (VITAMIN D2) 50000 units capsule Take 50,000 Units by mouth every Saturday.    Yes Historical Provider, MD  ipratropium (ATROVENT) 0.03 % nasal spray Place 2 sprays into both nostrils 3 (three) times daily.   Yes Historical Provider, MD  levothyroxine (SYNTHROID, LEVOTHROID) 50 MCG tablet Take 50  mcg by mouth daily before breakfast.   Yes Historical Provider, MD  loratadine (CLARITIN) 10 MG tablet Take 10 mg by mouth daily as needed for allergies.   Yes Historical Provider, MD  megestrol (MEGACE) 400 MG/10ML suspension Take 800 mg by mouth daily.   Yes Historical Provider, MD  Memantine HCl ER (NAMENDA XR) 28 MG CP24 Take 28 mg by mouth daily.    Yes Historical Provider, MD  mirtazapine (REMERON) 15 MG tablet Take 15 mg by mouth at bedtime.   Yes Historical Provider, MD  citalopram (CELEXA) 10 MG tablet Take 1 tablet (10 mg total) by mouth daily. Patient not taking: Reported on 04/16/2016 04/16/13   Reyne Dumas, MD    BP 112/59 mmHg  Pulse 80  Temp(Src) 98 F (36.7 C) (Oral)  Resp 16  SpO2 98% Physical Exam  Constitutional: She is oriented to person, place, and time. She appears well-developed and well-nourished.  HENT:  Head: Normocephalic and atraumatic.  Eyes: EOM are normal. Pupils are equal, round, and reactive to light.  Neck: Neck supple.  Cardiovascular: Normal rate, regular rhythm, normal heart sounds and intact distal pulses.   Pulmonary/Chest: Effort normal and breath sounds normal.  Abdominal: Soft. Bowel sounds are normal. She exhibits no distension. There is no tenderness.  Musculoskeletal: Normal range of motion. She exhibits no edema.  Neurological: She is alert and oriented to person, place, and time. She has normal strength. Coordination normal. GCS eye subscore is 4. GCS verbal subscore is 5. GCS motor subscore is 6.  Skin: Skin is warm, dry and intact.  Psychiatric:  Patient has flat affect. She follows all commands and answers questions.    ED Course  Procedures (including critical care time) Labs Review Labs Reviewed  COMPREHENSIVE METABOLIC PANEL - Abnormal; Notable for the following:    Chloride 100 (*)    BUN 28 (*)    ALT <5 (*)    GFR calc non Af Amer 58 (*)    All other components within normal limits  ACETAMINOPHEN LEVEL - Abnormal; Notable for the following:    Acetaminophen (Tylenol), Serum <10 (*)    All other components within normal limits  CBC WITH DIFFERENTIAL/PLATELET - Abnormal; Notable for the following:    RBC 5.17 (*)    All other components within normal limits  URINALYSIS, ROUTINE W REFLEX MICROSCOPIC (NOT AT [facility]) - Abnormal; Notable for the following:    Color, Urine AMBER (*)    APPearance CLOUDY (*)    Bilirubin Urine MODERATE (*)    Ketones, ur >80 (*)    All other components within normal limits  VALPROIC ACID LEVEL - Abnormal; Notable for the following:    Valproic Acid Lvl 43 (*)    All other components within normal limits   SALICYLATE LEVEL  TROPONIN I  URINE RAPID DRUG SCREEN, HOSP PERFORMED    Imaging Review No results found. I have personally reviewed and evaluated these images and lab results as part of my medical decision-making.   EKG Interpretation   Date/Time:  Tuesday April 16 2016 14:08:27 EDT Ventricular Rate:  60 PR Interval:  152 QRS Duration: 80 QT Interval:  476 QTC Calculation: 476 R Axis:   43 Text Interpretation:  Normal sinus rhythm Nonspecific T wave abnormality  Prolonged QT Abnormal ECG Confirmed by Johnney Killian, MD, Jeannie Done [phone]) on  04/16/2016 4:07:40 PM      MDM   Final diagnoses:  Severe episode of recurrent major depressive disorder, without psychotic features ([place])  Passive suicidal ideations  Dementia, without behavioral disturbance   Patient with ongoing depression. This is also exacerbated by underlying dementia. Patient has suicidal ideation with no attempt or plan. Patient is medically stable for psychiatric treatment. Plan will be for psychiatric admission.    Charlesetta Shanks, MD 04/16/16 1606  Charlesetta Shanks, MD 04/16/16 [phone]

## 2016-04-18 ENCOUNTER — Telehealth: Payer: Self-pay | Admitting: Geriatric Medicine

## 2016-04-18 NOTE — Telephone Encounter (Signed)
New message      The medical center is needing the last Echo faxed to them done back in 2015, fax to [phone], Attention Domenick Bookbinder

## 2016-04-19 NOTE — Telephone Encounter (Signed)
Echo faxed to [facility]

## 2016-06-21 DEATH — deceased

## 2017-01-21 IMAGING — CR DG CHEST 2V
2 series · 2 of 2 positions shown · non-contrast
Comparison: April 05, 2013

CLINICAL DATA: Medical clearance before [REDACTED]
admission.

EXAM:
CHEST  2 VIEW

[w chest lat]
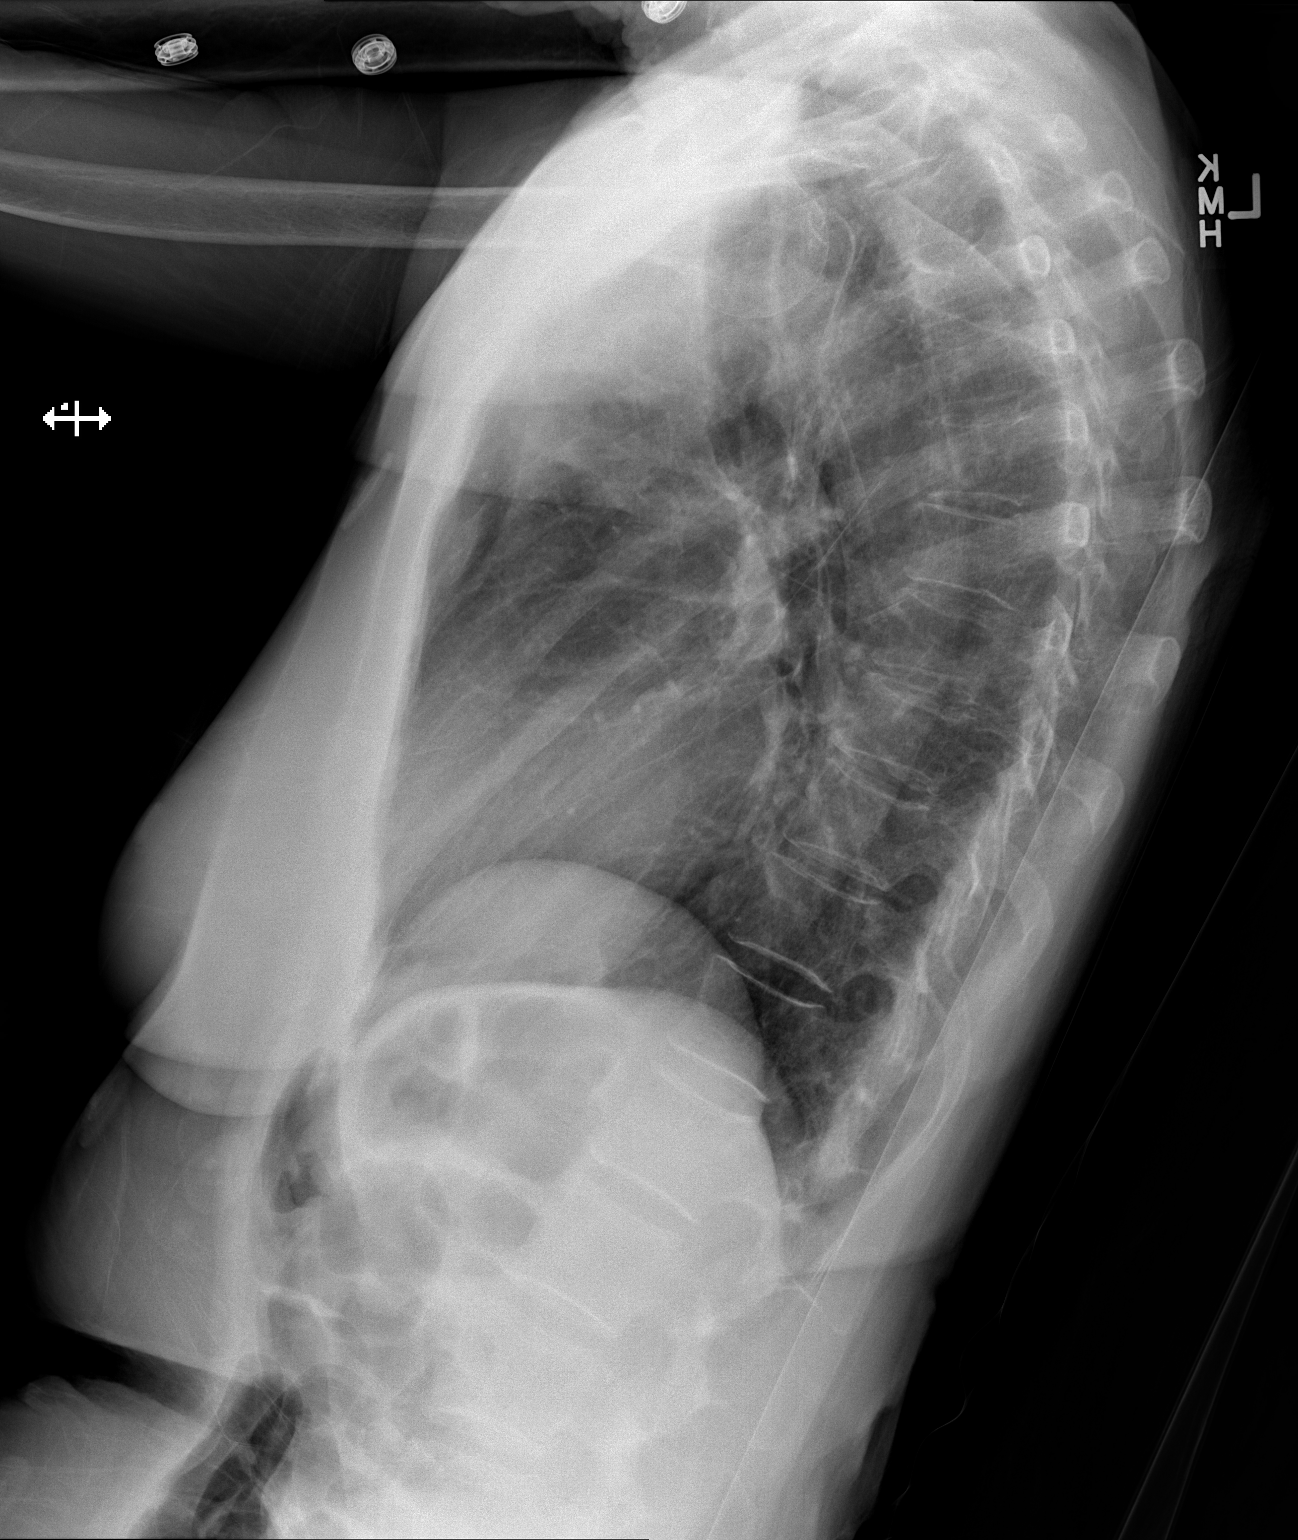

[x chest ap]
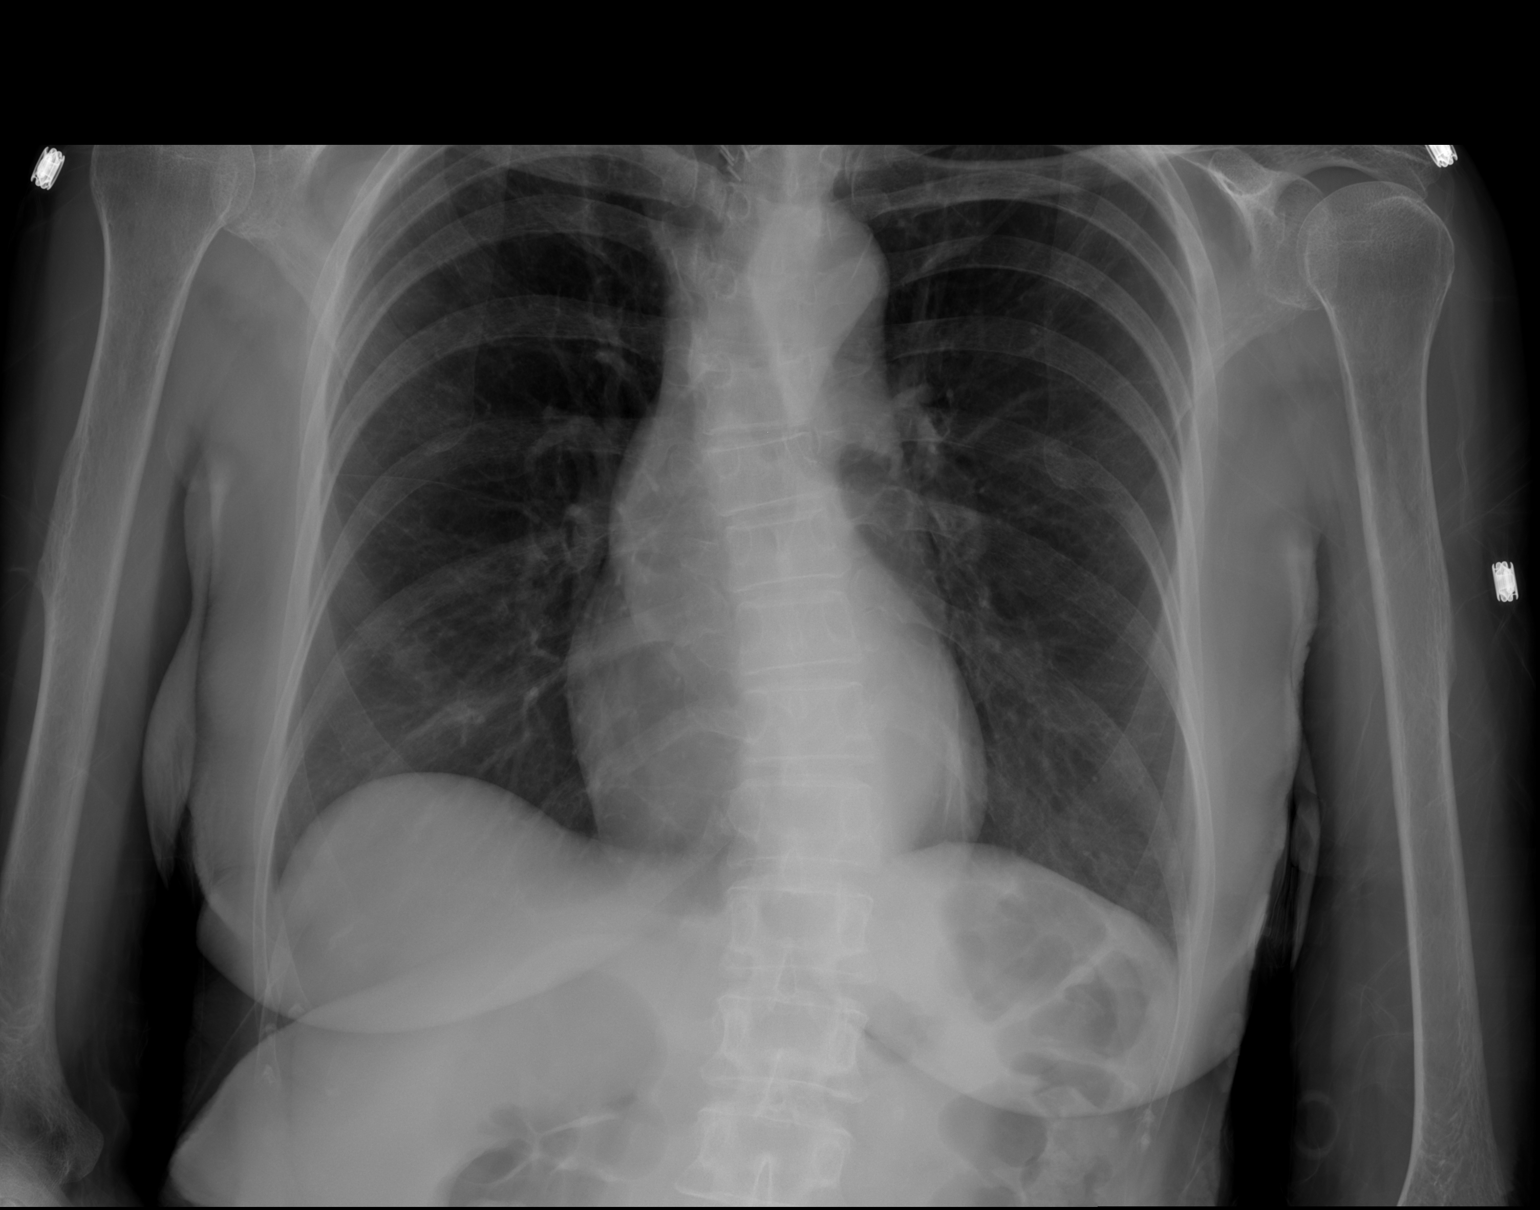

[2 of 2 positions shown; findings below may reference images not displayed]

FINDINGS: The heart size and mediastinal contours are within normal limits.
Both lungs are clear. The visualized skeletal structures are
unremarkable.
IMPRESSION: No active cardiopulmonary disease.
